# Patient Record
Sex: Female | Born: 1960
Health system: Southern US, Community
[De-identification: ages and names within clinical notes are randomized; demographics above are authoritative.]

## PROBLEM LIST (undated history)

## (undated) ENCOUNTER — Emergency Department (HOSPITAL_COMMUNITY): Admission: EM | Payer: 59 | Source: Home / Self Care

## (undated) DIAGNOSIS — N393 Stress incontinence (female) (male): Secondary | ICD-10-CM

## (undated) DIAGNOSIS — R112 Nausea with vomiting, unspecified: Secondary | ICD-10-CM

## (undated) DIAGNOSIS — M419 Scoliosis, unspecified: Secondary | ICD-10-CM

## (undated) DIAGNOSIS — Z9889 Other specified postprocedural states: Secondary | ICD-10-CM

## (undated) DIAGNOSIS — N3941 Urge incontinence: Secondary | ICD-10-CM

## (undated) DIAGNOSIS — K219 Gastro-esophageal reflux disease without esophagitis: Secondary | ICD-10-CM

## (undated) DIAGNOSIS — M549 Dorsalgia, unspecified: Secondary | ICD-10-CM

## (undated) DIAGNOSIS — D649 Anemia, unspecified: Secondary | ICD-10-CM

## (undated) DIAGNOSIS — I341 Nonrheumatic mitral (valve) prolapse: Secondary | ICD-10-CM

## (undated) DIAGNOSIS — G43909 Migraine, unspecified, not intractable, without status migrainosus: Secondary | ICD-10-CM

## (undated) DIAGNOSIS — R350 Frequency of micturition: Secondary | ICD-10-CM

## (undated) HISTORY — DX: Gastro-esophageal reflux disease without esophagitis: K21.9

## (undated) HISTORY — DX: Migraine, unspecified, not intractable, without status migrainosus: G43.909

## (undated) HISTORY — DX: Scoliosis, unspecified: M41.9

## (undated) HISTORY — PX: BREAST BIOPSY: SHX20

## (undated) HISTORY — PX: AUGMENTATION MAMMAPLASTY: SUR837

## (undated) HISTORY — PX: UPPER GASTROINTESTINAL ENDOSCOPY: SHX188

---

## 2004-11-21 ENCOUNTER — Other Ambulatory Visit: Admission: RE | Admit: 2004-11-21 | Discharge: 2004-11-21 | Payer: Self-pay | Admitting: Obstetrics and Gynecology

## 2006-01-22 ENCOUNTER — Other Ambulatory Visit: Admission: RE | Admit: 2006-01-22 | Discharge: 2006-01-22 | Payer: Self-pay | Admitting: Obstetrics and Gynecology

## 2015-01-30 ENCOUNTER — Ambulatory Visit (INDEPENDENT_AMBULATORY_CARE_PROVIDER_SITE_OTHER): Payer: 59 | Admitting: Internal Medicine

## 2015-01-30 ENCOUNTER — Encounter: Payer: Self-pay | Admitting: Internal Medicine

## 2015-01-30 ENCOUNTER — Other Ambulatory Visit (INDEPENDENT_AMBULATORY_CARE_PROVIDER_SITE_OTHER): Payer: 59

## 2015-01-30 VITALS — BP 108/60 | HR 87 | Temp 98.1°F | Resp 12 | Ht 64.0 in | Wt 113.4 lb

## 2015-01-30 DIAGNOSIS — Z0000 Encounter for general adult medical examination without abnormal findings: Secondary | ICD-10-CM

## 2015-01-30 DIAGNOSIS — M25569 Pain in unspecified knee: Secondary | ICD-10-CM | POA: Insufficient documentation

## 2015-01-30 DIAGNOSIS — K219 Gastro-esophageal reflux disease without esophagitis: Secondary | ICD-10-CM | POA: Insufficient documentation

## 2015-01-30 DIAGNOSIS — G43809 Other migraine, not intractable, without status migrainosus: Secondary | ICD-10-CM

## 2015-01-30 DIAGNOSIS — M25561 Pain in right knee: Secondary | ICD-10-CM

## 2015-01-30 DIAGNOSIS — G43909 Migraine, unspecified, not intractable, without status migrainosus: Secondary | ICD-10-CM | POA: Insufficient documentation

## 2015-01-30 LAB — URINALYSIS, ROUTINE W REFLEX MICROSCOPIC
BILIRUBIN URINE: NEGATIVE
HGB URINE DIPSTICK: NEGATIVE
Ketones, ur: NEGATIVE
LEUKOCYTES UA: NEGATIVE
NITRITE: NEGATIVE
PH: 6 (ref 5.0–8.0)
RBC / HPF: NONE SEEN (ref 0–?)
Specific Gravity, Urine: 1.02 (ref 1.000–1.030)
Total Protein, Urine: NEGATIVE
Urine Glucose: NEGATIVE
Urobilinogen, UA: 0.2 (ref 0.0–1.0)
WBC UA: NONE SEEN (ref 0–?)

## 2015-01-30 LAB — FERRITIN: Ferritin: 6.4 ng/mL — ABNORMAL LOW (ref 10.0–291.0)

## 2015-01-30 LAB — LIPID PANEL
Cholesterol: 154 mg/dL (ref 0–200)
HDL: 75.8 mg/dL
LDL Cholesterol: 67 mg/dL (ref 0–99)
NonHDL: 78.2
Total CHOL/HDL Ratio: 2
Triglycerides: 54 mg/dL (ref 0.0–149.0)
VLDL: 10.8 mg/dL (ref 0.0–40.0)

## 2015-01-30 LAB — COMPREHENSIVE METABOLIC PANEL
ALT: 10 U/L (ref 0–35)
AST: 17 U/L (ref 0–37)
Albumin: 4.2 g/dL (ref 3.5–5.2)
Alkaline Phosphatase: 57 U/L (ref 39–117)
BUN: 13 mg/dL (ref 6–23)
CO2: 31 mEq/L (ref 19–32)
Calcium: 9.3 mg/dL (ref 8.4–10.5)
Chloride: 101 mEq/L (ref 96–112)
Creatinine, Ser: 0.76 mg/dL (ref 0.40–1.20)
GFR: 84.69 mL/min (ref >60.00)
Glucose, Bld: 95 mg/dL (ref 70–99)
Potassium: 4 mEq/L (ref 3.5–5.1)
Sodium: 137 mEq/L (ref 135–145)
TOTAL PROTEIN: 6.9 g/dL (ref 6.0–8.3)
Total Bilirubin: 0.6 mg/dL (ref 0.2–1.2)

## 2015-01-30 LAB — CBC
HCT: 39 % (ref 36.0–46.0)
Hemoglobin: 12.9 g/dL (ref 12.0–15.0)
MCHC: 33.1 g/dL (ref 30.0–36.0)
MCV: 85.3 fl (ref 78.0–100.0)
Platelets: 263 K/uL (ref 150.0–400.0)
RBC: 4.57 Mil/uL (ref 3.87–5.11)
RDW: 15 % (ref 11.5–15.5)
WBC: 4.8 K/uL (ref 4.0–10.5)

## 2015-01-30 MED ORDER — EPINEPHRINE 0.3 MG/0.3ML IJ SOAJ: 0.3000 mg | Freq: Once | INTRAMUSCULAR | Status: DC

## 2015-01-30 MED ORDER — ELETRIPTAN HYDROBROMIDE 40 MG PO TABS: 40.0000 mg | ORAL_TABLET | ORAL | Status: DC | PRN

## 2015-01-30 MED ORDER — GABAPENTIN 800 MG PO TABS: 800.0000 mg | ORAL_TABLET | Freq: Every day | ORAL | Status: DC

## 2015-01-30 NOTE — Progress Notes (Signed)
Pre visit review using our clinic review tool, if applicable. No additional management support is needed unless otherwise documented below in the visit note. 

## 2015-01-30 NOTE — Progress Notes (Signed)
   Subjective:    Patient ID: Vicki Alexander, female    DOB: [birth year], 54 y.o.   MRN: [id number]  HPI The patient is a new 54 YO female who is coming in for wellness. She is out of her gabapentin which she uses for leg pain. She is a PA. Migraines rare. She exercises 4-5 days per week.   PMH, [facility], social history reviewed and updated with the patient.   Review of Systems  Constitutional: Negative for fever, activity change, appetite change, fatigue and unexpected weight change.  HENT: Negative.   Eyes: Negative.   Respiratory: Negative for cough, chest tightness, shortness of breath and wheezing.   Cardiovascular: Negative for chest pain, palpitations and leg swelling.  Gastrointestinal: Negative for nausea, abdominal pain, diarrhea, constipation and abdominal distention.  Musculoskeletal: Negative.   Skin: Negative.   Neurological: Negative.   Psychiatric/Behavioral: Negative.       Objective:   Physical Exam  Constitutional: She is oriented to person, place, and time. She appears well-developed and well-nourished.  HENT:  Head: Normocephalic and atraumatic.  Eyes: EOM are normal.  Neck: Normal range of motion.  Cardiovascular: Normal rate and regular rhythm.   Pulmonary/Chest: Effort normal and breath sounds normal. No respiratory distress. She has no wheezes. She has no rales.  Abdominal: Soft. She exhibits no distension. There is no tenderness. There is no rebound.  Musculoskeletal: She exhibits no edema.  Neurological: She is alert and oriented to person, place, and time. Coordination normal.  Skin: Skin is warm and dry.   Filed Vitals:   01/30/15 0810  BP: 108/60  Pulse: 87  Temp: 98.1 F (36.7 C)  TempSrc: Oral  Resp: 12  Height: 5\' 4"  (1.626 m)  Weight: 113 lb 6.4 oz (51.438 kg)  SpO2: 98%      Assessment & Plan:

## 2015-01-30 NOTE — Assessment & Plan Note (Signed)
Uses nexium OTC with good results.

## 2015-01-30 NOTE — Assessment & Plan Note (Signed)
Colonoscopy due in 2018, not sure of last mammogram and will get records to review. Checking labs today since it has been 5 years. Epi pen provided for bee allergy. She does regularly exercise and is a non-smoker.

## 2015-01-30 NOTE — Assessment & Plan Note (Signed)
Refill of relpax which she takes 3-4 times per month. Declines need for preventative treatment at this time. Is hoping to go into menopause soon and that this will help reduce frequency.

## 2015-01-30 NOTE — Assessment & Plan Note (Signed)
Refill of gabapentin provided today and given some nocturnal movement of the leg will check ferritin for possible iron deficiency.

## 2015-01-30 NOTE — Patient Instructions (Signed)
We will check on the blood and the urine today and call you back with the results.   We have refilled your medicines and can see you back next year.  We will get your records and update the computer.   If you have any problems or questions please call the office.   Health Maintenance Adopting a healthy lifestyle and getting preventive care can go a long way to promote health and wellness. Talk with your health care provider about what schedule of regular examinations is right for you. This is a good chance for you to check in with your provider about disease prevention and staying healthy. In between checkups, there are plenty of things you can do on your own. Experts have done a lot of research about which lifestyle changes and preventive measures are most likely to keep you healthy. Ask your health care provider for more information. WEIGHT AND DIET  Eat a healthy diet  Be sure to include plenty of vegetables, fruits, low-fat dairy products, and lean protein.  Do not eat a lot of foods high in solid fats, added sugars, or salt.  Get regular exercise. This is one of the most important things you can do for your health.  Most adults should exercise for at least 150 minutes each week. The exercise should increase your heart rate and make you sweat (moderate-intensity exercise).  Most adults should also do strengthening exercises at least twice a week. This is in addition to the moderate-intensity exercise.  Maintain a healthy weight  Body mass index (BMI) is a measurement that can be used to identify possible weight problems. It estimates body fat based on height and weight. Your health care provider can help determine your BMI and help you achieve or maintain a healthy weight.  For females 34 years of age and older:   A BMI below 18.5 is considered underweight.  A BMI of 18.5 to 24.9 is normal.  A BMI of 25 to 29.9 is considered overweight.  A BMI of 30 and above is considered  obese.  Watch levels of cholesterol and blood lipids  You should start having your blood tested for lipids and cholesterol at 53 years of age, then have this test every 5 years.  You may need to have your cholesterol levels checked more often if:  Your lipid or cholesterol levels are high.  You are older than 53 years of age.  You are at high risk for heart disease.  CANCER SCREENING   Lung Cancer  Lung cancer screening is recommended for adults 53-74 years old who are at high risk for lung cancer because of a history of smoking.  A yearly low-dose CT scan of the lungs is recommended for people who:  Currently smoke.  Have quit within the past 15 years.  Have at least a 30-pack-year history of smoking. A pack year is smoking an average of one pack of cigarettes a day for 1 year.  Yearly screening should continue until it has been 15 years since you quit.  Yearly screening should stop if you develop a health problem that would prevent you from having lung cancer treatment.  Breast Cancer  Practice breast self-awareness. This means understanding how your breasts normally appear and feel.  It also means doing regular breast self-exams. Let your health care provider know about any changes, no matter how small.  If you are in your 20s or 30s, you should have a clinical breast exam (CBE) by a health  care provider every 1-3 years as part of a regular health exam.  If you are 53 or older, have a CBE every year. Also consider having a breast X-ray (mammogram) every year.  If you have a family history of breast cancer, talk to your health care provider about genetic screening.  If you are at high risk for breast cancer, talk to your health care provider about having an MRI and a mammogram every year.  Breast cancer gene (BRCA) assessment is recommended for women who have family members with BRCA-related cancers. BRCA-related cancers  include:  Breast.  Ovarian.  Tubal.  Peritoneal cancers.  Results of the assessment will determine the need for genetic counseling and BRCA1 and BRCA2 testing. Cervical Cancer Routine pelvic examinations to screen for cervical cancer are no longer recommended for nonpregnant women who are considered low risk for cancer of the pelvic organs (ovaries, uterus, and vagina) and who do not have symptoms. A pelvic examination may be necessary if you have symptoms including those associated with pelvic infections. Ask your health care provider if a screening pelvic exam is right for you.   The Pap test is the screening test for cervical cancer for women who are considered at risk.  If you had a hysterectomy for a problem that was not cancer or a condition that could lead to cancer, then you no longer need Pap tests.  If you are older than 65 years, and you have had normal Pap tests for the past 10 years, you no longer need to have Pap tests.  If you have had past treatment for cervical cancer or a condition that could lead to cancer, you need Pap tests and screening for cancer for at least 20 years after your treatment.  If you no longer get a Pap test, assess your risk factors if they change (such as having a new sexual partner). This can affect whether you should start being screened again.  Some women have medical problems that increase their chance of getting cervical cancer. If this is the case for you, your health care provider may recommend more frequent screening and Pap tests.  The human papillomavirus (HPV) test is another test that may be used for cervical cancer screening. The HPV test looks for the virus that can cause cell changes in the cervix. The cells collected during the Pap test can be tested for HPV.  The HPV test can be used to screen women 53 years of age and older. Getting tested for HPV can extend the interval between normal Pap tests from three to five years.  An HPV  test also should be used to screen women of any age who have unclear Pap test results.  After 53 years of age, women should have HPV testing as often as Pap tests.  Colorectal Cancer  This type of cancer can be detected and often prevented.  Routine colorectal cancer screening usually begins at 53 years of age and continues through 53 years of age.  Your health care provider may recommend screening at an earlier age if you have risk factors for colon cancer.  Your health care provider may also recommend using home test kits to check for hidden blood in the stool.  A small camera at the end of a tube can be used to examine your colon directly (sigmoidoscopy or colonoscopy). This is done to check for the earliest forms of colorectal cancer.  Routine screening usually begins at age 62.  Direct examination of the  colon should be repeated every 5-10 years through 53 years of age. However, you may need to be screened more often if early forms of precancerous polyps or small growths are found. Skin Cancer  Check your skin from head to toe regularly.  Tell your health care provider about any new moles or changes in moles, especially if there is a change in a mole's shape or color.  Also tell your health care provider if you have a mole that is larger than the size of a pencil eraser.  Always use sunscreen. Apply sunscreen liberally and repeatedly throughout the day.  Protect yourself by wearing long sleeves, pants, a wide-brimmed hat, and sunglasses whenever you are outside. HEART DISEASE, DIABETES, AND HIGH BLOOD PRESSURE   Have your blood pressure checked at least every 1-2 years. High blood pressure causes heart disease and increases the risk of stroke.  If you are between 78 years and 29 years old, ask your health care provider if you should take aspirin to prevent strokes.  Have regular diabetes screenings. This involves taking a blood sample to check your fasting blood sugar  level.  If you are at a normal weight and have a low risk for diabetes, have this test once every three years after 53 years of age.  If you are overweight and have a high risk for diabetes, consider being tested at a younger age or more often. PREVENTING INFECTION  Hepatitis B  If you have a higher risk for hepatitis B, you should be screened for this virus. You are considered at high risk for hepatitis B if:  You were born in a country where hepatitis B is common. Ask your health care provider which countries are considered high risk.  Your parents were born in a high-risk country, and you have not been immunized against hepatitis B (hepatitis B vaccine).  You have HIV or AIDS.  You use needles to inject street drugs.  You live with someone who has hepatitis B.  You have had sex with someone who has hepatitis B.  You get hemodialysis treatment.  You take certain medicines for conditions, including cancer, organ transplantation, and autoimmune conditions. Hepatitis C  Blood testing is recommended for:  Everyone born from [birth year] through 1964.  Anyone with known risk factors for hepatitis C. Sexually transmitted infections (STIs)  You should be screened for sexually transmitted infections (STIs) including gonorrhea and chlamydia if:  You are sexually active and are younger than 53 years of age.  You are older than 53 years of age and your health care provider tells you that you are at risk for this type of infection.  Your sexual activity has changed since you were last screened and you are at an increased risk for chlamydia or gonorrhea. Ask your health care provider if you are at risk.  If you do not have HIV, but are at risk, it may be recommended that you take a prescription medicine daily to prevent HIV infection. This is called pre-exposure prophylaxis (PrEP). You are considered at risk if:  You are sexually active and do not regularly use condoms or know the HIV status  of your partner(s).  You take drugs by injection.  You are sexually active with a partner who has HIV. Talk with your health care provider about whether you are at high risk of being infected with HIV. If you choose to begin PrEP, you should first be tested for HIV. You should then be tested every 3 months  for as long as you are taking PrEP.  PREGNANCY   If you are premenopausal and you may become pregnant, ask your health care provider about preconception counseling.  If you may become pregnant, take 400 to 800 micrograms (mcg) of folic acid every day.  If you want to prevent pregnancy, talk to your health care provider about birth control (contraception). OSTEOPOROSIS AND MENOPAUSE   Osteoporosis is a disease in which the bones lose minerals and strength with aging. This can result in serious bone fractures. Your risk for osteoporosis can be identified using a bone density scan.  If you are 64 years of age or older, or if you are at risk for osteoporosis and fractures, ask your health care provider if you should be screened.  Ask your health care provider whether you should take a calcium or vitamin D supplement to lower your risk for osteoporosis.  Menopause may have certain physical symptoms and risks.  Hormone replacement therapy may reduce some of these symptoms and risks. Talk to your health care provider about whether hormone replacement therapy is right for you.  HOME CARE INSTRUCTIONS   Schedule regular health, dental, and eye exams.  Stay current with your immunizations.   Do not use any tobacco products including cigarettes, chewing tobacco, or electronic cigarettes.  If you are pregnant, do not drink alcohol.  If you are breastfeeding, limit how much and how often you drink alcohol.  Limit alcohol intake to no more than 1 drink per day for nonpregnant women. One drink equals 12 ounces of beer, 5 ounces of wine, or 1 ounces of hard liquor.  Do not use street  drugs.  Do not share needles.  Ask your health care provider for help if you need support or information about quitting drugs.  Tell your health care provider if you often feel depressed.  Tell your health care provider if you have ever been abused or do not feel safe at home. Document Released: 03/10/2011 Document Revised: 01/09/2014 Document Reviewed: 07/27/2013 [facility] Patient Information 2015 [place], [place]. This information is not intended to replace advice given to you by your health care provider. Make sure you discuss any questions you have with your health care provider.

## 2015-02-01 ENCOUNTER — Telehealth: Payer: Self-pay | Admitting: Internal Medicine

## 2015-02-01 NOTE — Telephone Encounter (Signed)
Pt was returning you phone call.  She would like for you to call her back on her cell when you have a chance

## 2015-02-01 NOTE — Telephone Encounter (Signed)
Spoke with patient and discussed lab results.  

## 2015-02-14 ENCOUNTER — Telehealth: Payer: Self-pay | Admitting: Internal Medicine

## 2015-02-14 NOTE — Telephone Encounter (Signed)
Received records from [facility] forwarded to Dr. Doug Sou 02/14/15 fbg

## 2015-07-11 ENCOUNTER — Telehealth: Payer: Self-pay | Admitting: Internal Medicine

## 2015-07-11 NOTE — Telephone Encounter (Signed)
disregard

## 2015-07-12 ENCOUNTER — Ambulatory Visit: Payer: 59

## 2015-07-12 ENCOUNTER — Ambulatory Visit (INDEPENDENT_AMBULATORY_CARE_PROVIDER_SITE_OTHER): Payer: 59

## 2015-07-12 DIAGNOSIS — Z23 Encounter for immunization: Secondary | ICD-10-CM | POA: Diagnosis not present

## 2015-08-08 ENCOUNTER — Encounter: Payer: Self-pay | Admitting: Obstetrics & Gynecology

## 2015-08-08 ENCOUNTER — Ambulatory Visit (INDEPENDENT_AMBULATORY_CARE_PROVIDER_SITE_OTHER): Payer: 59 | Admitting: Obstetrics & Gynecology

## 2015-08-08 VITALS — BP 119/73 | HR 85 | Temp 98.2°F | Wt 119.2 lb

## 2015-08-08 DIAGNOSIS — Z01419 Encounter for gynecological examination (general) (routine) without abnormal findings: Secondary | ICD-10-CM

## 2015-08-08 DIAGNOSIS — Z1151 Encounter for screening for human papillomavirus (HPV): Secondary | ICD-10-CM | POA: Diagnosis not present

## 2015-08-08 DIAGNOSIS — R1900 Intra-abdominal and pelvic swelling, mass and lump, unspecified site: Secondary | ICD-10-CM

## 2015-08-08 DIAGNOSIS — Z0000 Encounter for general adult medical examination without abnormal findings: Secondary | ICD-10-CM

## 2015-08-08 DIAGNOSIS — Z124 Encounter for screening for malignant neoplasm of cervix: Secondary | ICD-10-CM | POA: Diagnosis not present

## 2015-08-08 DIAGNOSIS — R198 Other specified symptoms and signs involving the digestive system and abdomen: Secondary | ICD-10-CM

## 2015-08-08 LAB — TSH: TSH: 0.854 u[IU]/mL (ref 0.350–4.500)

## 2015-08-08 NOTE — Progress Notes (Signed)
Subjective:    Vicki Alexander is a 54 y.o. MW P2 (85 and 47 yo kids) female who presents for an annual exam. The patient has no complaints today except increased weight (10 pounds) and increasing abdominal girth. She is concerned that she could have a mass. She also has had frequent urination but no dysuria. She started GSUI about a month ago. The patient is sexually active. GYN screening history: last pap: was normal. The patient wears seatbelts: yes. The patient participates in regular exercise: yes. Has the patient ever been transfused or tattooed?: no. The patient reports that there is not domestic violence in her life.   Menstrual History: OB History    No data available      Menarche age: 43  No LMP recorded (lmp unknown).    The following portions of the patient's history were reviewed and updated as appropriate: allergies, current medications, past family history, past medical history, past social history, past surgical history and problem list.  Review of Systems Pertinent items are noted in HPI. Married for 24 years. Denies dyspareunia, uses lube. Periods getting much lighter. Some hot flashes recently. Husband s/p vasectomy. 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 Colonscopy due in 2 years.   Objective:    BP 119/73 mmHg  Pulse 85  Temp(Src) 98.2 F (36.8 C) (Oral)  Wt 119 lb 3.2 oz (54.069 kg)  LMP  (LMP Unknown)  General Appearance:    Alert, cooperative, no distress, appears stated age  Head:    Normocephalic, without obvious abnormality, atraumatic  Eyes:    PERRL, conjunctiva/corneas clear, EOM's intact, fundi    benign, both eyes  Ears:    Normal TM's and external ear canals, both ears  Nose:   Nares normal, septum midline, mucosa normal, no drainage    or sinus tenderness  Throat:   Lips, mucosa, and tongue normal; teeth and gums normal  Neck:   Supple, symmetrical, trachea midline, no adenopathy;    thyroid:  no enlargement/tenderness/nodules; no  carotid   bruit or JVD  Back:     Symmetric, no curvature, ROM normal, no CVA tenderness  Lungs:     Clear to auscultation bilaterally, respirations unlabored  Chest Wall:    No tenderness or deformity   Heart:    Regular rate and rhythm, S1 and S2 normal, no murmur, rub   or gallop  Breast Exam:    No tenderness, masses, or nipple abnormality  Abdomen:     Soft, non-tender, bowel sounds active all four quadrants,    no masses, no organomegaly  Genitalia:    Normal female without lesion, discharge or tenderness, difficult exam due to her tight abdominal muscles     Extremities:   Extremities normal, atraumatic, no cyanosis or edema  Pulses:   2+ and symmetric all extremities  Skin:   Skin color, texture, turgor normal, no rashes or lesions  Lymph nodes:   Cervical, supraclavicular, and axillary nodes normal  Neurologic:   CNII-XII intact, normal strength, sensation and reflexes    throughout  .    Assessment:    Healthy female exam.    Plan:     Breast self exam technique reviewed and patient encouraged to perform self-exam monthly. Mammogram. Thin prep Pap smear.  with cotesting Schedule gyn u/s Schedule My Risk at [facility]

## 2015-08-09 LAB — CYTOLOGY - PAP

## 2015-08-10 ENCOUNTER — Other Ambulatory Visit: Payer: Self-pay

## 2015-08-10 DIAGNOSIS — Z1231 Encounter for screening mammogram for malignant neoplasm of breast: Secondary | ICD-10-CM

## 2015-08-13 ENCOUNTER — Other Ambulatory Visit (INDEPENDENT_AMBULATORY_CARE_PROVIDER_SITE_OTHER): Payer: 59

## 2015-08-13 ENCOUNTER — Other Ambulatory Visit (HOSPITAL_COMMUNITY): Payer: Self-pay | Admitting: Obstetrics & Gynecology

## 2015-08-13 ENCOUNTER — Ambulatory Visit (HOSPITAL_COMMUNITY)
Admission: RE | Admit: 2015-08-13 | Discharge: 2015-08-13 | Disposition: A | Discharge status: Home / Self Care | Payer: 59 | Source: Ambulatory Visit | Attending: Internal Medicine | Admitting: Internal Medicine

## 2015-08-13 DIAGNOSIS — Z1379 Encounter for other screening for genetic and chromosomal anomalies: Secondary | ICD-10-CM

## 2015-08-13 DIAGNOSIS — Z1231 Encounter for screening mammogram for malignant neoplasm of breast: Secondary | ICD-10-CM

## 2015-08-13 DIAGNOSIS — Z803 Family history of malignant neoplasm of breast: Secondary | ICD-10-CM | POA: Diagnosis not present

## 2015-08-13 DIAGNOSIS — R1900 Intra-abdominal and pelvic swelling, mass and lump, unspecified site: Secondary | ICD-10-CM | POA: Diagnosis not present

## 2015-08-13 DIAGNOSIS — Z8041 Family history of malignant neoplasm of ovary: Secondary | ICD-10-CM

## 2015-08-13 DIAGNOSIS — R635 Abnormal weight gain: Secondary | ICD-10-CM | POA: Diagnosis not present

## 2015-08-13 NOTE — Progress Notes (Signed)
Patient presents for My Risk blood draw per Dr. Hulan Fray. Patient states she saw Dr. Hulan Fray at [facility] and they did not have the My Risk screening kit there. Kathrene Alu RN BSN.

## 2015-08-14 ENCOUNTER — Ambulatory Visit (HOSPITAL_COMMUNITY)
Admission: RE | Admit: 2015-08-14 | Discharge: 2015-08-14 | Disposition: A | Discharge status: Home / Self Care | Payer: 59 | Source: Ambulatory Visit | Attending: Obstetrics & Gynecology | Admitting: Obstetrics & Gynecology

## 2015-08-14 DIAGNOSIS — Z1231 Encounter for screening mammogram for malignant neoplasm of breast: Secondary | ICD-10-CM | POA: Diagnosis not present

## 2015-08-14 DIAGNOSIS — R198 Other specified symptoms and signs involving the digestive system and abdomen: Secondary | ICD-10-CM

## 2015-08-21 ENCOUNTER — Encounter: Payer: Self-pay | Admitting: Obstetrics & Gynecology

## 2015-08-21 ENCOUNTER — Ambulatory Visit (INDEPENDENT_AMBULATORY_CARE_PROVIDER_SITE_OTHER): Payer: 59 | Admitting: Obstetrics & Gynecology

## 2015-08-21 DIAGNOSIS — N393 Stress incontinence (female) (male): Secondary | ICD-10-CM

## 2015-08-21 NOTE — Addendum Note (Signed)
Addended by: Phill Myron on: 08/21/2015 04:33 PM   Modules accepted: Orders

## 2015-08-21 NOTE — Progress Notes (Signed)
   Subjective:    Patient ID: Vicki Alexander, female    DOB: June 23, 1961, 54 y.o.   MRN: [id number]  HPI 54 yo MW P2 (73 an d15 yo kids, both VAVD and NSVD).She is here today to discuss her u/s results. They were normal.  She is also here with the issue of GSUI. This started about 2-3 years ago but has gotten "horrible" in the last 2-3 months. She denies any urge incontinence.   Review of Systems     Objective:   Physical Exam [id number] Breathing, conversing, and ambulating normally Abd- benign +Qtip test       Assessment & Plan:  GSUI- I offered her a urology consult versus scheduling her for a mid urethral sling. She opts to have the sling at this practice.

## 2015-08-22 ENCOUNTER — Encounter (HOSPITAL_COMMUNITY): Payer: Self-pay | Admitting: *Deleted

## 2015-08-23 LAB — URINE CULTURE
Colony Count: NO GROWTH
ORGANISM ID, BACTERIA: NO GROWTH

## 2015-08-28 ENCOUNTER — Encounter (HOSPITAL_COMMUNITY): Payer: Self-pay | Admitting: *Deleted

## 2015-08-29 ENCOUNTER — Ambulatory Visit: Payer: 59

## 2015-10-03 MED FILL — RELPAX 40 MG TABLET: 40 | 30 days supply | Qty: 9 | Fill #1

## 2015-10-30 ENCOUNTER — Encounter (HOSPITAL_COMMUNITY)
Admission: RE | Admit: 2015-10-30 | Discharge: 2015-10-30 | Disposition: A | Discharge status: Home / Self Care | Payer: 59 | Source: Ambulatory Visit | Attending: Obstetrics & Gynecology | Admitting: Obstetrics & Gynecology

## 2015-10-30 ENCOUNTER — Encounter (HOSPITAL_COMMUNITY): Payer: Self-pay

## 2015-10-30 DIAGNOSIS — Z01812 Encounter for preprocedural laboratory examination: Secondary | ICD-10-CM | POA: Diagnosis not present

## 2015-10-30 HISTORY — DX: Dorsalgia, unspecified: M54.9

## 2015-10-30 HISTORY — DX: Nausea with vomiting, unspecified: Z98.890

## 2015-10-30 HISTORY — DX: Nonrheumatic mitral (valve) prolapse: I34.1

## 2015-10-30 HISTORY — DX: Urge incontinence: N39.41

## 2015-10-30 HISTORY — DX: Anemia, unspecified: D64.9

## 2015-10-30 HISTORY — DX: Nausea with vomiting, unspecified: R11.2

## 2015-10-30 HISTORY — DX: Frequency of micturition: R35.0

## 2015-10-30 HISTORY — DX: Stress incontinence (female) (male): N39.3

## 2015-10-30 LAB — ABO/RH: ABO/RH(D): B POS

## 2015-10-30 LAB — CBC
HEMATOCRIT: 42.7 % (ref 36.0–46.0)
HEMOGLOBIN: 14.5 g/dL (ref 12.0–15.0)
MCH: 30 pg (ref 26.0–34.0)
MCHC: 34 g/dL (ref 30.0–36.0)
MCV: 88.2 fL (ref 78.0–100.0)
Platelets: 283 10*3/uL (ref 150–400)
RBC: 4.84 MIL/uL (ref 3.87–5.11)
RDW: 14.1 % (ref 11.5–15.5)
WBC: 5.4 10*3/uL (ref 4.0–10.5)

## 2015-10-30 LAB — TYPE AND SCREEN
ABO/RH(D): B POS
Antibody Screen: NEGATIVE

## 2015-10-30 NOTE — Patient Instructions (Signed)
Your procedure is scheduled on:  Tuesday, Feb, 28, 2017  Enter through the [employer] of [facility] at: 6:00 A.M.  Pick up the phone at the desk and dial [phone].  Call this number if you have problems the morning of surgery: [phone].  Remember:  Do NOT eat food or drink after: Midnight Monday  Take these medicines the morning of surgery with a SIP OF WATER: Nexium  Do NOT wear jewelry (body piercing), metal hair clips/bobby pins, make-up, or nail polish. Do NOT wear lotions, powders, or perfumes.  You may wear deoderant. Do NOT shave for 48 hours prior to surgery. Do NOT bring valuables to the hospital. Contacts, dentures, or bridgework may not be worn into surgery. Leave suitcase in car.  After surgery it may be brought to your room.  For patients admitted to the hospital, checkout time is 11:00 AM the day of discharge. Have a responsible adult drive you home and stay with you for 24 hours after your procedure

## 2015-11-05 MED ORDER — DOXYCYCLINE HYCLATE 100 MG IV SOLR
200.0000 mg | INTRAVENOUS | Status: AC
  Administered 2015-11-06: 200 mg via INTRAVENOUS
  Filled 2015-11-05: qty 200

## 2015-11-06 ENCOUNTER — Ambulatory Visit (HOSPITAL_COMMUNITY): Payer: 59 | Admitting: Registered Nurse

## 2015-11-06 ENCOUNTER — Ambulatory Visit (HOSPITAL_COMMUNITY)
Admission: RE | Admit: 2015-11-06 | Discharge: 2015-11-06 | Disposition: A | Discharge status: Home / Self Care | Payer: 59 | Source: Ambulatory Visit | Attending: Obstetrics & Gynecology | Admitting: Obstetrics & Gynecology

## 2015-11-06 ENCOUNTER — Encounter (HOSPITAL_COMMUNITY)
Admission: RE | Disposition: A | Discharge status: Home / Self Care | Payer: Self-pay | Source: Ambulatory Visit | Attending: Obstetrics & Gynecology

## 2015-11-06 ENCOUNTER — Encounter (HOSPITAL_COMMUNITY): Payer: Self-pay | Admitting: *Deleted

## 2015-11-06 DIAGNOSIS — N393 Stress incontinence (female) (male): Secondary | ICD-10-CM

## 2015-11-06 DIAGNOSIS — I341 Nonrheumatic mitral (valve) prolapse: Secondary | ICD-10-CM | POA: Diagnosis not present

## 2015-11-06 DIAGNOSIS — N3946 Mixed incontinence: Secondary | ICD-10-CM | POA: Diagnosis not present

## 2015-11-06 DIAGNOSIS — M549 Dorsalgia, unspecified: Secondary | ICD-10-CM | POA: Diagnosis not present

## 2015-11-06 DIAGNOSIS — M419 Scoliosis, unspecified: Secondary | ICD-10-CM | POA: Diagnosis not present

## 2015-11-06 DIAGNOSIS — J45909 Unspecified asthma, uncomplicated: Secondary | ICD-10-CM | POA: Diagnosis not present

## 2015-11-06 DIAGNOSIS — G43909 Migraine, unspecified, not intractable, without status migrainosus: Secondary | ICD-10-CM | POA: Insufficient documentation

## 2015-11-06 DIAGNOSIS — K219 Gastro-esophageal reflux disease without esophagitis: Secondary | ICD-10-CM | POA: Insufficient documentation

## 2015-11-06 DIAGNOSIS — Z91030 Bee allergy status: Secondary | ICD-10-CM | POA: Insufficient documentation

## 2015-11-06 DIAGNOSIS — D649 Anemia, unspecified: Secondary | ICD-10-CM | POA: Diagnosis not present

## 2015-11-06 HISTORY — PX: CYSTO: SHX6284

## 2015-11-06 HISTORY — PX: PUBOVAGINAL SLING: SHX1035

## 2015-11-06 LAB — PREGNANCY, URINE: PREG TEST UR: NEGATIVE

## 2015-11-06 SURGERY — CREATION, PUBOVAGINAL SLING
Anesthesia: General | Site: Vagina

## 2015-11-06 MED ORDER — PROPOFOL 10 MG/ML IV BOLUS
INTRAVENOUS | Status: AC
  Filled 2015-11-06: qty 20

## 2015-11-06 MED ORDER — BUPIVACAINE HCL (PF) 0.5 % IJ SOLN
INTRAMUSCULAR | Status: AC
  Filled 2015-11-06: qty 30

## 2015-11-06 MED ORDER — DEXAMETHASONE SODIUM PHOSPHATE 4 MG/ML IJ SOLN
INTRAMUSCULAR | Status: AC
  Filled 2015-11-06: qty 1

## 2015-11-06 MED ORDER — PROMETHAZINE HCL 25 MG/ML IJ SOLN: 6.2500 mg | INTRAMUSCULAR | Status: DC | PRN

## 2015-11-06 MED ORDER — BUPIVACAINE HCL (PF) 0.5 % IJ SOLN
INTRAMUSCULAR | Status: DC | PRN
  Administered 2015-11-06: 30 mL

## 2015-11-06 MED ORDER — PROPOFOL 10 MG/ML IV BOLUS
INTRAVENOUS | Status: DC | PRN
  Administered 2015-11-06: 150 mg via INTRAVENOUS

## 2015-11-06 MED ORDER — STERILE WATER FOR IRRIGATION IR SOLN
Status: DC | PRN
  Administered 2015-11-06: 1000 mL via INTRAVESICAL

## 2015-11-06 MED ORDER — FENTANYL CITRATE (PF) 100 MCG/2ML IJ SOLN: 25.0000 ug | INTRAMUSCULAR | Status: DC | PRN

## 2015-11-06 MED ORDER — OXYCODONE-ACETAMINOPHEN 5-325 MG PO TABS: 1.0000 | ORAL_TABLET | Freq: Four times a day (QID) | ORAL | Status: DC | PRN

## 2015-11-06 MED ORDER — FENTANYL CITRATE (PF) 100 MCG/2ML IJ SOLN
INTRAMUSCULAR | Status: DC | PRN
  Administered 2015-11-06 (×2): 50 ug via INTRAVENOUS

## 2015-11-06 MED ORDER — FENTANYL CITRATE (PF) 250 MCG/5ML IJ SOLN
INTRAMUSCULAR | Status: AC
  Filled 2015-11-06: qty 5

## 2015-11-06 MED ORDER — KETOROLAC TROMETHAMINE 30 MG/ML IJ SOLN: 30.0000 mg | Freq: Once | INTRAMUSCULAR | Status: DC

## 2015-11-06 MED ORDER — IBUPROFEN 600 MG PO TABS: 600.0000 mg | ORAL_TABLET | Freq: Four times a day (QID) | ORAL | Status: DC | PRN

## 2015-11-06 MED ORDER — LIDOCAINE HCL (CARDIAC) 20 MG/ML IV SOLN
INTRAVENOUS | Status: AC
  Filled 2015-11-06: qty 5

## 2015-11-06 MED ORDER — MIDAZOLAM HCL 2 MG/2ML IJ SOLN
INTRAMUSCULAR | Status: AC
  Filled 2015-11-06: qty 2

## 2015-11-06 MED ORDER — ONDANSETRON HCL 4 MG/2ML IJ SOLN
INTRAMUSCULAR | Status: DC | PRN
  Administered 2015-11-06: 4 mg via INTRAVENOUS

## 2015-11-06 MED ORDER — LIDOCAINE HCL (CARDIAC) 20 MG/ML IV SOLN
INTRAVENOUS | Status: DC | PRN
  Administered 2015-11-06: 50 mg via INTRAVENOUS

## 2015-11-06 MED ORDER — SCOPOLAMINE 1 MG/3DAYS TD PT72
MEDICATED_PATCH | TRANSDERMAL | Status: AC
  Administered 2015-11-06: 1.5 mg via TRANSDERMAL
  Filled 2015-11-06: qty 1

## 2015-11-06 MED ORDER — LACTATED RINGERS IV SOLN
INTRAVENOUS | Status: DC
  Administered 2015-11-06 (×2): via INTRAVENOUS

## 2015-11-06 MED ORDER — SCOPOLAMINE 1 MG/3DAYS TD PT72
1.0000 | MEDICATED_PATCH | Freq: Once | TRANSDERMAL | Status: DC
  Administered 2015-11-06: 1.5 mg via TRANSDERMAL

## 2015-11-06 MED ORDER — ONDANSETRON HCL 4 MG/2ML IJ SOLN
INTRAMUSCULAR | Status: AC
  Filled 2015-11-06: qty 2

## 2015-11-06 MED ORDER — DEXAMETHASONE SODIUM PHOSPHATE 4 MG/ML IJ SOLN
INTRAMUSCULAR | Status: DC | PRN
  Administered 2015-11-06: 4 mg via INTRAVENOUS

## 2015-11-06 MED ORDER — MIDAZOLAM HCL 5 MG/5ML IJ SOLN
INTRAMUSCULAR | Status: DC | PRN
  Administered 2015-11-06: 2 mg via INTRAVENOUS

## 2015-11-06 MED FILL — OXYCODONE/APAP 5-325: 5-325 | 4 days supply | Qty: 30 | Fill #0

## 2015-11-06 SURGICAL SUPPLY — 29 items
BLADE SURG 11 STRL SS (BLADE) ×3 IMPLANT
BLADE SURG 15 STRL LF C SS BP (BLADE) ×2 IMPLANT
BLADE SURG 15 STRL SS (BLADE) ×6
CANISTER SUCT 3000ML (MISCELLANEOUS) ×3 IMPLANT
CATH FOLEY 2WAY SLVR  5CC 18FR (CATHETERS) ×1
CATH FOLEY 2WAY SLVR 5CC 18FR (CATHETERS) ×2 IMPLANT
CLOTH BEACON ORANGE TIMEOUT ST (SAFETY) ×3 IMPLANT
DECANTER SPIKE VIAL GLASS SM (MISCELLANEOUS) ×1 IMPLANT
GLOVE BIO SURGEON STRL SZ 6.5 (GLOVE) ×3 IMPLANT
GLOVE BIOGEL PI IND STRL 6.5 (GLOVE) ×2 IMPLANT
GLOVE BIOGEL PI IND STRL 7.0 (GLOVE) ×2 IMPLANT
GLOVE BIOGEL PI INDICATOR 6.5 (GLOVE) ×1
GLOVE BIOGEL PI INDICATOR 7.0 (GLOVE) ×2
GOWN STRL REUS W/TWL LRG LVL3 (GOWN DISPOSABLE) ×12 IMPLANT
HEMOSTAT SURGICEL 2X3 (HEMOSTASIS) IMPLANT
LIQUID BAND (GAUZE/BANDAGES/DRESSINGS) ×3 IMPLANT
NDL SPNL 18GX3.5 QUINCKE PK (NEEDLE) ×2 IMPLANT
NEEDLE SPNL 18GX3.5 QUINCKE PK (NEEDLE) ×3 IMPLANT
NS IRRIG 1000ML POUR BTL (IV SOLUTION) ×2 IMPLANT
PACK VAGINAL WOMENS (CUSTOM PROCEDURE TRAY) ×3 IMPLANT
SET CYSTO W/LG BORE CLAMP LF (SET/KITS/TRAYS/PACK) ×3 IMPLANT
SLING TVT EXACT (Sling) ×1 IMPLANT
SUT VIC AB 2-0 SH 27 (SUTURE)
SUT VIC AB 2-0 SH 27XBRD (SUTURE) IMPLANT
SUT VIC AB 4-0 SH 27 (SUTURE) ×6
SUT VIC AB 4-0 SH 27XANBCTRL (SUTURE) ×4 IMPLANT
TOWEL OR 17X24 6PK STRL BLUE (TOWEL DISPOSABLE) ×6 IMPLANT
TRAY FOLEY CATH SILVER 14FR (SET/KITS/TRAYS/PACK) ×2 IMPLANT
WATER STERILE IRR 1000ML POUR (IV SOLUTION) ×3 IMPLANT

## 2015-11-06 NOTE — Op Note (Signed)
11/06/2015  10:35 AM  PATIENT:  Vicki Alexander  55 y.o. female  PRE-OPERATIVE DIAGNOSIS:  GENUINE URINARY STRESS INCONTINENCE  POST-OPERATIVE DIAGNOSIS:  SAME  PROCEDURE:  Procedure(s): MID URETHERAL SLING (N/A) CYSTO (N/A)  SURGEON:  Surgeon(s) and Role:    * Emily Filbert, MD - Primary    * Lavonia Drafts, MD - Assisting  ANESTHESIA:   general  EBL:  Total I/O In: 1400 [I.V.:1400] Out: 40 [Urine:20; Blood:20]  BLOOD ADMINISTERED:none  DRAINS: none   LOCAL MEDICATIONS USED:  MARCAINE     SPECIMEN:  No Specimen  DISPOSITION OF SPECIMEN:  N/A  COUNTS:  YES  TOURNIQUET:  * No tourniquets in log *  DICTATION: .Dragon Dictation  PLAN OF CARE: Discharge to home after PACU  PATIENT DISPOSITION:  PACU - hemodynamically stable.   Delay start of Pharmacological VTE agent (>24hrs) due to surgical blood loss or risk of bleeding: not applicable  The risks, benefits, and alternatives of surgery were explained, understood, and accepted. All questions were answered. Consent form was signed. In the operating room, she was placed in the dorsal lithotomy position. Once comfortable, she was intubated. Her vagina was prepped and draped in the usual sterile fashion.I infiltrated the spaces about 2 cm lateral to the midline behind the symphysis pubis on each side with a total of 20 cc of 0.5% marcaien. I then infiltrated the vaginal mucosa with 0.5% marcaine. I used a total of 30 mL of this anesthetic during the whole case. I made a 1 cm incision just below my Allis clamp, and used Metzenbaum scissors to dissect  each side of the urethral meatus. Please note that I had a urethral stylet in place, deviating the bladder out of the operative site. I then placed the TVT Exact mid urethral sling to the patient's right and then to the left. I performed cystoscopy and noted no evidence of a cystotomy. I left approximately 200 mL of saline in her bladder. I then elevated the sling, making sure  there was no tension on the sling. I placed a Kelly clamps between the sling and the patient's urethra to prevent overtightening. The plastic sheaths were removed, and the sling was cut at the skin edges. The skin edges were closed with Dermabond. The vaginal mucosal incision was closed with a 4-0 Vicryl running locking suture. Excellent hemostasis was noted. She was extubated and taken to the recovery room in excellent condition. The instrument sponge and needle counts were correct.

## 2015-11-06 NOTE — Transfer of Care (Signed)
Immediate Anesthesia Transfer of Care Note  Patient: Vicki Alexander  Procedure(s) Performed: Procedure(s): MID URETHERAL SLING (N/A) CYSTO (N/A)  Patient Location: PACU  Anesthesia Type:General  Level of Consciousness: awake, alert  and oriented  Airway & Oxygen Therapy: Patient Spontanous Breathing and Patient connected to nasal cannula oxygen  Post-op Assessment: Report given to RN  Post vital signs: Reviewed  Last Vitals:  Filed Vitals:   11/06/15 0626  BP: 100/64  Pulse: 88  Temp: 36.6 C  Resp: 16    Complications: No apparent anesthesia complications

## 2015-11-06 NOTE — Anesthesia Postprocedure Evaluation (Signed)
Anesthesia Post Note  Patient: Vicki Alexander  Procedure(s) Performed: Procedure(s) (LRB): MID URETHERAL SLING (N/A) CYSTO (N/A)  Patient location during evaluation: PACU Anesthesia Type: General Level of consciousness: awake and alert Pain management: pain level controlled Vital Signs Assessment: post-procedure vital signs reviewed and stable Respiratory status: spontaneous breathing, nonlabored ventilation, respiratory function stable and patient connected to nasal cannula oxygen Cardiovascular status: blood pressure returned to baseline and stable Postop Assessment: no signs of nausea or vomiting Anesthetic complications: no    Last Vitals:  Filed Vitals:   11/06/15 0818 11/06/15 0830  BP: 109/67 101/62  Pulse: 77 68  Temp: 36.4 C   Resp: 12 14    Last Pain: There were no vitals filed for this visit.               Chalyn Amescua S

## 2015-11-06 NOTE — H&P (Signed)
Vicki Alexander is an 55 y.o. female MW P2 (93 and 72 yo kids) here today for midurethral sling and cystoscopy due to new onset GSUI. She wears pads. She denies urge incontinence.    No LMP recorded. LMP about 4 months ago    Past Medical History  Diagnosis Date  . GERD (gastroesophageal reflux disease)   . Migraine   . Scoliosis   . Mitral valve prolapse   . Urgency incontinence   . Urinary frequency   . Stress incontinence   . Anemia     ferritin stores low  . Back pain   . PONV (postoperative nausea and vomiting)   . Vaginal delivery 1993, 1997    Past Surgical History  Procedure Laterality Date  . Upper gastrointestinal endoscopy    . Breast biopsy Bilateral     times three, implants    Family History  Problem Relation Age of Onset  . Cancer Mother     breast and ovarian  . Stroke Father   . Diabetes Father   . Cancer Maternal Aunt     breast  . Stroke Maternal Grandmother   . Heart disease Maternal Grandmother   . Diabetes Maternal Grandmother   . Cancer Maternal Grandfather     colon  . Stroke Maternal Grandfather   . Heart disease Maternal Grandfather   . Diabetes Maternal Grandfather   . Stroke Paternal Grandmother   . Heart disease Paternal Grandmother   . Diabetes Paternal Grandmother   . Stroke Paternal Grandfather   . Heart disease Paternal Grandfather   . Diabetes Paternal Grandfather     Social History:  reports that she has never smoked. She has never used smokeless tobacco. She reports that she drinks alcohol. She reports that she does not use illicit drugs.  Allergies:  Allergies  Allergen Reactions  . Bee Venom Anaphylaxis    Prescriptions prior to admission  Medication Sig Dispense Refill Last Dose  . eletriptan (RELPAX) 40 MG tablet Take 1 tablet (40 mg total) by mouth as needed for migraine or headache. May repeat in 2 hours if headache persists or recurs. 10 tablet 6 Past Month at Unknown time  . EPINEPHrine (EPIPEN 2-PAK) 0.3 mg/0.3  mL IJ SOAJ injection Inject 0.3 mLs (0.3 mg total) into the muscle once. (Patient taking differently: Inject 0.3 mg into the muscle once as needed (for emergency/rescue allergic reactions). ) 1 Device 0 Taking  . esomeprazole (NEXIUM) 20 MG capsule Take 40 mg by mouth daily at 12 noon.    11/06/2015 at Unknown time  . gabapentin (NEURONTIN) 800 MG tablet Take 1 tablet (800 mg total) by mouth daily. (Patient taking differently: Take 800 mg by mouth at bedtime. ) 30 tablet 6 11/05/2015 at Unknown time  . Multiple Vitamin (MULTIVITAMIN WITH MINERALS) TABS tablet Take 1 tablet by mouth daily.   Past Week at Unknown time    ROS  Blood pressure 100/64, pulse 88, temperature 97.8 F (36.6 C), temperature source Oral, resp. rate 16, SpO2 98 %. Physical Exam  Heart- rrr Lungs- CTAB Abd- benign  Results for orders placed or performed during the hospital encounter of 11/06/15 (from the past 24 hour(s))  Pregnancy, urine     Status: None   Collection Time: 11/06/15  6:00 AM  Result Value Ref Range   Preg Test, Ur NEGATIVE NEGATIVE    No results found.  Assessment/Plan: GSUI- plan for sling with cystoscopy. We discussed the 5% risk of inducing urge incontinence.  She  understands the risks of surgery, including, but not to infection, bleeding, DVTs, damage to bowel, bladder, ureters. She wishes to proceed.     Cassady Turano C. 11/06/2015, 7:17 AM

## 2015-11-06 NOTE — Anesthesia Preprocedure Evaluation (Signed)
Anesthesia Evaluation  Patient identified by MRN, date of birth, ID band Patient awake    Reviewed: Allergy & Precautions, NPO status , Patient's Chart, lab work & pertinent test results  Airway Mallampati: II  TM Distance: >3 FB Neck ROM: Full    Dental no notable dental hx.    Pulmonary neg pulmonary ROS,    Pulmonary exam normal breath sounds clear to auscultation       Cardiovascular negative cardio ROS Normal cardiovascular exam Rhythm:Regular Rate:Normal     Neuro/Psych negative neurological ROS  negative psych ROS   GI/Hepatic Neg liver ROS, GERD  Medicated,  Endo/Other  negative endocrine ROS  Renal/GU negative Renal ROS  negative genitourinary   Musculoskeletal negative musculoskeletal ROS (+)   Abdominal   Peds negative pediatric ROS (+)  Hematology negative hematology ROS (+)   Anesthesia Other Findings   Reproductive/Obstetrics negative OB ROS                             Anesthesia Physical Anesthesia Plan  ASA: II  Anesthesia Plan: General   Post-op Pain Management:    Induction: Intravenous  Airway Management Planned: LMA  Additional Equipment:   Intra-op Plan:   Post-operative Plan: Extubation in OR  Informed Consent: I have reviewed the patients History and Physical, chart, labs and discussed the procedure including the risks, benefits and alternatives for the proposed anesthesia with the patient or authorized representative who has indicated his/her understanding and acceptance.   Dental advisory given  Plan Discussed with: CRNA and Surgeon  Anesthesia Plan Comments:         Anesthesia Quick Evaluation

## 2015-11-06 NOTE — Anesthesia Procedure Notes (Signed)
Procedure Name: LMA Insertion Date/Time: 11/06/2015 7:42 AM Performed by: Talbot Grumbling Pre-anesthesia Checklist: Patient identified, Emergency Drugs available, Suction available and Patient being monitored Patient Re-evaluated:Patient Re-evaluated prior to inductionOxygen Delivery Method: Circle system utilized Preoxygenation: Pre-oxygenation with 100% oxygen Intubation Type: IV induction Ventilation: Mask ventilation without difficulty LMA: LMA inserted LMA Size: 3.0 Number of attempts: 1 Placement Confirmation: positive ETCO2 and breath sounds checked- equal and bilateral Tube secured with: Tape Dental Injury: Teeth and Oropharynx as per pre-operative assessment

## 2015-11-06 NOTE — Discharge Instructions (Signed)
Urethral Vaginal Sling, Care After Refer to this sheet in the next few weeks. These instructions provide you with information on caring for yourself after your procedure. Your health care provider may also give you more specific instructions. Your treatment has been planned according to current medical practices, but problems sometimes occur. Call your health care provider if you have any problems or questions after your procedure.  WHAT TO EXPECT AFTER THE PROCEDURE  After your procedure, it is typical to have the following:  A catheter in your bladder until your bladder is able to work on its own properly. You will be instructed on how to empty the catheter bag.  Absorbable stitches in your incisions. They will slowly dissolve over 1-2 months. HOME CARE INSTRUCTIONS  Get plenty of rest.  Only take over-the-counter or prescription medicines as directed by your health care provider. Do not take aspirin because it can cause bleeding.  Do not take baths. Take showers until your health care provider tells you otherwise.  You may resume your usual diet. Eat a well-balanced diet.  Drink enough fluids to keep your urine clear or pale yellow.  Limit exercise and activities as directed by your health care provider. Do not lift anything heavier than 5 pounds (2.3 kg).  Do not douche, use tampons, or have sexual intercourse for 6 weeks after your procedure.  Follow up with your health care provider as directed. SEEK MEDICAL CARE IF:  You have a heavy or bad smelling vaginal discharge.   You have a rash.   You have pain that is not controlled with medicines.   You have lightheadedness or feel faint.  SEEK IMMEDIATE MEDICAL CARE IF:  You have a fever.   You have vaginal bleeding.   You faint.   You have shortness of breath.   You have chest, abdominal, or leg pain.   You have pain when urinating or cannot urinate.   Your catheter is still in your bladder and becomes  blocked.   You have swelling, redness, and pain in the vaginal area.    This information is not intended to replace advice given to you by your health care provider. Make sure you discuss any questions you have with your health care provider.   Document Released: 06/15/2013 Document Reviewed: 06/15/2013 Elsevier Interactive Patient Education [employer].

## 2015-11-07 ENCOUNTER — Encounter (HOSPITAL_COMMUNITY): Payer: Self-pay | Admitting: Obstetrics & Gynecology

## 2015-12-24 ENCOUNTER — Ambulatory Visit (INDEPENDENT_AMBULATORY_CARE_PROVIDER_SITE_OTHER): Payer: 59 | Admitting: Obstetrics & Gynecology

## 2015-12-24 ENCOUNTER — Encounter: Payer: Self-pay | Admitting: Obstetrics & Gynecology

## 2015-12-24 VITALS — BP 110/64 | HR 91 | Ht 64.0 in | Wt 116.0 lb

## 2015-12-24 DIAGNOSIS — Z9889 Other specified postprocedural states: Secondary | ICD-10-CM

## 2015-12-24 MED ORDER — ESCITALOPRAM OXALATE 10 MG PO TABS: 10.0000 mg | ORAL_TABLET | Freq: Every day | ORAL | Status: DC

## 2015-12-24 MED FILL — ESCITALOPRAM 10 MG TABLET: 10 | 90 days supply | Qty: 90 | Fill #0

## 2015-12-24 MED FILL — GABAPENTIN 800 MG TABLET: 800 | 30 days supply | Qty: 30 | Fill #3

## 2015-12-24 NOTE — Progress Notes (Signed)
   Subjective:    Patient ID: Vicki Alexander, female    DOB: 01-Nov-1960, 55 y.o.   MRN: [id number]  HPI  55 yo MW lady here for a 6 week post op visit after having a TVT Exact mid urethral sling and cystoscopy. She is extremely happy with her results. She has been having sex with no dyspareunia.  Her only complaint is that of worsening hot flashes. She declines estrogen as her mother had breast cancer. She has some difficulty with insomnia and has tried doxepin.  Review of Systems     Objective:   Physical Exam [id number] Breathing, conversing, and ambulating normally Abd- benign Vagina- healed well      Assessment & Plan:  GSUI- cured with sling Hot flashes-lexapro 10 mg qhs

## 2016-11-05 ENCOUNTER — Other Ambulatory Visit (HOSPITAL_COMMUNITY): Payer: Self-pay | Admitting: Obstetrics & Gynecology

## 2016-11-05 ENCOUNTER — Other Ambulatory Visit: Payer: Self-pay | Admitting: Neurology

## 2016-11-05 DIAGNOSIS — Z0000 Encounter for general adult medical examination without abnormal findings: Secondary | ICD-10-CM

## 2016-11-06 ENCOUNTER — Ambulatory Visit (HOSPITAL_BASED_OUTPATIENT_CLINIC_OR_DEPARTMENT_OTHER)
Admission: RE | Admit: 2016-11-06 | Discharge: 2016-11-06 | Disposition: A | Discharge status: Home / Self Care | Payer: 59 | Source: Ambulatory Visit | Attending: Obstetrics & Gynecology | Admitting: Obstetrics & Gynecology

## 2016-11-06 ENCOUNTER — Encounter (HOSPITAL_BASED_OUTPATIENT_CLINIC_OR_DEPARTMENT_OTHER): Payer: Self-pay

## 2016-11-06 DIAGNOSIS — Z0000 Encounter for general adult medical examination without abnormal findings: Secondary | ICD-10-CM | POA: Diagnosis not present

## 2016-11-06 DIAGNOSIS — Z1231 Encounter for screening mammogram for malignant neoplasm of breast: Secondary | ICD-10-CM | POA: Diagnosis not present

## 2017-01-19 DIAGNOSIS — Z1283 Encounter for screening for malignant neoplasm of skin: Secondary | ICD-10-CM | POA: Diagnosis not present

## 2017-01-19 DIAGNOSIS — L821 Other seborrheic keratosis: Secondary | ICD-10-CM | POA: Diagnosis not present

## 2017-01-19 DIAGNOSIS — L570 Actinic keratosis: Secondary | ICD-10-CM | POA: Diagnosis not present

## 2017-01-19 DIAGNOSIS — X32XXXD Exposure to sunlight, subsequent encounter: Secondary | ICD-10-CM | POA: Diagnosis not present

## 2017-03-11 ENCOUNTER — Encounter (HOSPITAL_COMMUNITY): Payer: Self-pay | Admitting: Emergency Medicine

## 2017-03-11 ENCOUNTER — Ambulatory Visit (HOSPITAL_COMMUNITY)
Admission: EM | Admit: 2017-03-11 | Discharge: 2017-03-11 | Disposition: A | Discharge status: Home / Self Care | Payer: 59 | Attending: Family Medicine | Admitting: Family Medicine

## 2017-03-11 DIAGNOSIS — S61031A Puncture wound without foreign body of right thumb without damage to nail, initial encounter: Secondary | ICD-10-CM

## 2017-03-11 DIAGNOSIS — T148XXA Other injury of unspecified body region, initial encounter: Secondary | ICD-10-CM | POA: Diagnosis not present

## 2017-03-11 DIAGNOSIS — W268XXA Contact with other sharp object(s), not elsewhere classified, initial encounter: Secondary | ICD-10-CM

## 2017-03-11 DIAGNOSIS — Z23 Encounter for immunization: Secondary | ICD-10-CM

## 2017-03-11 MED ORDER — TETANUS-DIPHTH-ACELL PERTUSSIS 5-2.5-18.5 LF-MCG/0.5 IM SUSP
INTRAMUSCULAR | Status: AC
  Filled 2017-03-11: qty 0.5

## 2017-03-11 MED ORDER — TETANUS-DIPHTH-ACELL PERTUSSIS 5-2.5-18.5 LF-MCG/0.5 IM SUSP
0.5000 mL | Freq: Once | INTRAMUSCULAR | Status: AC
  Administered 2017-03-11: 0.5 mL via INTRAMUSCULAR
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
## 2017-03-11 NOTE — ED Provider Notes (Signed)
CSN: [id number]     Arrival date & time 03/11/17  1353 History   First MD Initiated Contact with Patient 03/11/17 1415     Chief Complaint  Patient presents with  . Puncture Wound   (Consider location/radiation/quality/duration/timing/severity/associated sxs/prior Treatment) Patient is here today wanting a tetanus booster vaccine. She reports a minor puncture wound to right thumb secondary to [employer]. She states the spear was kind of rusty.       Past Medical History:  Diagnosis Date  . Anemia    ferritin stores low  . Back pain   . GERD (gastroesophageal reflux disease)   . Migraine   . Mitral valve prolapse   . PONV (postoperative nausea and vomiting)   . Scoliosis   . Stress incontinence   . Urgency incontinence   . Urinary frequency   . Vaginal delivery 1993, 1997   Past Surgical History:  Procedure Laterality Date  . AUGMENTATION MAMMAPLASTY    . BREAST BIOPSY Bilateral    times three, implants 2 left 1 right   . CYSTO N/A 11/06/2015   Procedure: CYSTO;  Surgeon: Emily Filbert, MD;  Location: [facility];  Service: Gynecology;  Laterality: N/A;  . PUBOVAGINAL SLING N/A 11/06/2015   Procedure: MID D[place] Bussing;  Surgeon: Emily Filbert, MD;  Location: [facility];  Service: Gynecology;  Laterality: N/A;  . UPPER GASTROINTESTINAL ENDOSCOPY     Family History  Problem Relation Age of Onset  . Cancer Mother        breast and ovarian  . Stroke Father   . Diabetes Father   . Cancer Maternal Aunt        breast  . Stroke Maternal Grandmother   . Heart disease Maternal Grandmother   . Diabetes Maternal Grandmother   . Cancer Maternal Grandfather        colon  . Stroke Maternal Grandfather   . Heart disease Maternal Grandfather   . Diabetes Maternal Grandfather   . Stroke Paternal Grandmother   . Heart disease Paternal Grandmother   . Diabetes Paternal Grandmother   . Stroke Paternal Grandfather   . Heart disease Paternal Grandfather   . Diabetes Paternal Grandfather     Social History  Substance Use Topics  . Smoking status: Never Smoker  . Smokeless tobacco: Never Used  . Alcohol use 0.0 oz/week     Comment: occ   OB History    No data available     Review of Systems  Constitutional:       See HPI    Allergies  Bee venom  Home Medications   Prior to Admission medications   Medication Sig Start Date End Date Taking? Authorizing Provider  eletriptan (RELPAX) 40 MG tablet Take 1 tablet (40 mg total) by mouth as needed for migraine or headache. May repeat in 2 hours if headache persists or recurs. 01/30/15   Hoyt Koch, MD  EPINEPHrine (EPIPEN 2-PAK) 0.3 mg/0.3 mL IJ SOAJ injection Inject 0.3 mLs (0.3 mg total) into the muscle once. Patient taking differently: Inject 0.3 mg into the muscle once as needed (for emergency/rescue allergic reactions).  01/30/15   Hoyt Koch, MD  escitalopram (LEXAPRO) 10 MG tablet Take 1 tablet (10 mg total) by mouth daily. 12/24/15   Emily Filbert, MD  esomeprazole (NEXIUM) 20 MG capsule Take 40 mg by mouth daily at 12 noon.     [provider]  gabapentin (NEURONTIN) 800 MG tablet Take 1 tablet (800 mg  total) by mouth daily. Patient taking differently: Take 800 mg by mouth at bedtime.  01/30/15   Hoyt Koch, MD  ibuprofen (ADVIL,MOTRIN) 600 MG tablet Take 1 tablet (600 mg total) by mouth every 6 (six) hours as needed. 11/06/15   Emily Filbert, MD  Multiple Vitamin (MULTIVITAMIN WITH MINERALS) TABS tablet Take 1 tablet by mouth daily.    [provider]   Meds Ordered and Administered this Visit   Medications  Tdap (BOOSTRIX) injection 0.5 mL (not administered)    BP 111/62 (BP Location: Left Arm)   Pulse 77   Temp 97.8 F (36.6 C) (Oral)   Resp 16   SpO2 99%  No data found.  Physical Exam  Constitutional: She is oriented to person, place, and time. She appears well-developed and well-nourished.  Cardiovascular: Normal rate, regular rhythm and normal heart  sounds.   Pulmonary/Chest: Effort normal and breath sounds normal. She has no wheezes.  Neurological: She is alert and oriented to person, place, and time.  Skin:  Has a superficial minor puncture wound to right thumb, no bleeding, no swelling, no redness, no pain.   Nursing note and vitals reviewed.   Urgent Care Course     Procedures (including critical care time)  Labs Review Labs Reviewed - No data to display  Imaging Review No results found.   MDM   1. Puncture wound    Wound looks unremarkable. Wound care discussed. Tdap given today. Return as needed   Barry Dienes, NP 03/11/17 1426

## 2017-04-27 ENCOUNTER — Other Ambulatory Visit: Payer: 59 | Admitting: Internal Medicine

## 2017-04-27 DIAGNOSIS — Z130 Encounter for screening for diseases of the blood and blood-forming organs and certain disorders involving the immune mechanism: Secondary | ICD-10-CM

## 2017-04-27 DIAGNOSIS — Z1321 Encounter for screening for nutritional disorder: Secondary | ICD-10-CM | POA: Diagnosis not present

## 2017-04-27 DIAGNOSIS — Z13220 Encounter for screening for lipoid disorders: Secondary | ICD-10-CM

## 2017-04-27 DIAGNOSIS — Z0000 Encounter for general adult medical examination without abnormal findings: Secondary | ICD-10-CM

## 2017-04-27 DIAGNOSIS — Z1329 Encounter for screening for other suspected endocrine disorder: Secondary | ICD-10-CM

## 2017-04-27 LAB — CBC WITH DIFFERENTIAL/PLATELET
BASOS ABS: 0 {cells}/uL (ref 0–200)
Basophils Relative: 0 %
EOS ABS: 240 {cells}/uL (ref 15–500)
EOS PCT: 6 %
HCT: 40.7 % (ref 35.0–45.0)
HEMOGLOBIN: 13.5 g/dL (ref 11.7–15.5)
Lymphocytes Relative: 56 %
Lymphs Abs: 2240 cells/uL (ref 850–3900)
MCH: 30.7 pg (ref 27.0–33.0)
MCHC: 33.2 g/dL (ref 32.0–36.0)
MCV: 92.5 fL (ref 80.0–100.0)
MPV: 10.4 fL (ref 7.5–12.5)
Monocytes Absolute: 320 cells/uL (ref 200–950)
Monocytes Relative: 8 %
NEUTROS ABS: 1200 {cells}/uL — AB (ref 1500–7800)
NEUTROS PCT: 30 %
Platelets: 261 10*3/uL (ref 140–400)
RBC: 4.4 MIL/uL (ref 3.80–5.10)
RDW: 13.7 % (ref 11.0–15.0)
WBC: 4 10*3/uL (ref 3.8–10.8)

## 2017-04-27 LAB — TSH: TSH: 1.24 m[IU]/L

## 2017-04-28 ENCOUNTER — Ambulatory Visit (INDEPENDENT_AMBULATORY_CARE_PROVIDER_SITE_OTHER): Payer: 59 | Admitting: Internal Medicine

## 2017-04-28 ENCOUNTER — Encounter: Payer: Self-pay | Admitting: Internal Medicine

## 2017-04-28 VITALS — BP 92/66 | HR 76 | Ht 64.25 in | Wt 117.0 lb

## 2017-04-28 DIAGNOSIS — Z0000 Encounter for general adult medical examination without abnormal findings: Secondary | ICD-10-CM | POA: Diagnosis not present

## 2017-04-28 DIAGNOSIS — R232 Flushing: Secondary | ICD-10-CM | POA: Diagnosis not present

## 2017-04-28 DIAGNOSIS — G4700 Insomnia, unspecified: Secondary | ICD-10-CM

## 2017-04-28 DIAGNOSIS — Z8669 Personal history of other diseases of the nervous system and sense organs: Secondary | ICD-10-CM | POA: Diagnosis not present

## 2017-04-28 DIAGNOSIS — E559 Vitamin D deficiency, unspecified: Secondary | ICD-10-CM | POA: Diagnosis not present

## 2017-04-28 DIAGNOSIS — Z803 Family history of malignant neoplasm of breast: Secondary | ICD-10-CM

## 2017-04-28 DIAGNOSIS — Z23 Encounter for immunization: Secondary | ICD-10-CM | POA: Diagnosis not present

## 2017-04-28 LAB — COMPLETE METABOLIC PANEL WITH GFR
ALBUMIN: 4.1 g/dL (ref 3.6–5.1)
ALK PHOS: 80 U/L (ref 33–130)
ALT: 10 U/L (ref 6–29)
AST: 15 U/L (ref 10–35)
BILIRUBIN TOTAL: 0.5 mg/dL (ref 0.2–1.2)
BUN: 13 mg/dL (ref 7–25)
CALCIUM: 9.6 mg/dL (ref 8.6–10.4)
CO2: 25 mmol/L (ref 20–32)
Chloride: 104 mmol/L (ref 98–110)
Creat: 0.79 mg/dL (ref 0.50–1.05)
GFR, EST NON AFRICAN AMERICAN: 85 mL/min (ref >=60)
Glucose, Bld: 84 mg/dL (ref 65–99)
Potassium: 4.3 mmol/L (ref 3.5–5.3)
Sodium: 143 mmol/L (ref 135–146)
TOTAL PROTEIN: 6.3 g/dL (ref 6.1–8.1)

## 2017-04-28 LAB — POCT URINALYSIS DIPSTICK
Bilirubin, UA: NEGATIVE
Glucose, UA: NEGATIVE
Ketones, UA: NEGATIVE
Leukocytes, UA: NEGATIVE
NITRITE UA: NEGATIVE
PH UA: 6 (ref 5.0–8.0)
Protein, UA: NEGATIVE
RBC UA: NEGATIVE
SPEC GRAV UA: 1.01 (ref 1.010–1.025)
UROBILINOGEN UA: 0.2 U/dL

## 2017-04-28 LAB — LIPID PANEL
CHOL/HDL RATIO: 2.8 ratio (ref <5.0)
CHOLESTEROL: 174 mg/dL (ref <200)
HDL: 63 mg/dL (ref >50)
LDL Cholesterol: 94 mg/dL (ref <100)
TRIGLYCERIDES: 84 mg/dL (ref <150)
VLDL: 17 mg/dL (ref <30)

## 2017-04-28 LAB — VITAMIN D 25 HYDROXY (VIT D DEFICIENCY, FRACTURES): Vit D, 25-Hydroxy: 23 ng/mL — ABNORMAL LOW (ref 30–100)

## 2017-04-28 MED ORDER — VENLAFAXINE HCL ER 75 MG PO CP24: 75.0000 mg | ORAL_CAPSULE | Freq: Every day | ORAL | 1 refills | Status: DC

## 2017-04-28 MED FILL — VENLAFAXINE HCL ER 75 MG CA: 75 | 30 days supply | Qty: 30 | Fill #0

## 2017-04-28 NOTE — Progress Notes (Signed)
Subjective:    Patient ID: Vicki Alexander, female    DOB: 11/06/1960, 56 y.o.   MRN: [id number]  HPI   First visit for this pleasant 56 year old White Female in today for health maintenance exam and evaluation of medical issues.  She has no history of serious illnesses requiring hospitalization. No operations or fractures. No known drug allergies.  History of scoliosis and used to take gabapentin.  She has no dental will 2010 with precancerous polyps and osteopenia.  She had last tetanus immunization 03/11/2017 at [facility]  Has seen Dr. Larey Days for OB/GYN care about a year ago.  Social history: Her husband as a Garment/textile technologist with [employer]. 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 She has a Scientist, water quality. She does not smoke. Rarely drinks alcohol. Exercises 3 times a week. 2 children a son age 55 and a daughter age 26 both of whom are in good health  Family history: Father died at 63 of lung cancer. Mother age 63 with history of breast cancer at age 26 and hyperthyroidism. Father with history of diabetes, kidney stone and stroke. Breast cancer in maternal grandmother. Diabetes in maternal grandmother, maternal uncle, maternal aunt. Colon cancer in maternal grandfather.  History of vaginal sling for stress urinary incontinence February 2017 by Dr. Hulan Fray  Patient had breast augmentation at 56 years of age and subsequently has had 3 breast biopsies which have been negative. Menarche was at age 31. Had colonoscopy in 2012.  Review of Systems issues with night sweats and insomnia. Currently no pain from scoliosis.History of migraine headaches treated with Relpax.  Lab work reviewed with her. She has mildly decreased vitamin D at 23. Recommend 2000 units vitamin D 3 daily. Urine specimen by dipstick is normal. TSH is normal. Lipid panel is normal. Complete metabolic panel is normal. CBC is within  normal limits.  Had normal Pap smear by Dr. Hulan Fray in 2016     Objective:   Physical Exam  Constitutional: She appears well-developed and well-nourished. No distress.  HENT:  Head: Normocephalic and atraumatic.  Right Ear: External ear normal.  Left Ear: External ear normal.  Mouth/Throat: Oropharynx is clear and moist. No oropharyngeal exudate.  Eyes: Pupils are equal, round, and reactive to light. Conjunctivae and EOM are normal. Right eye exhibits no discharge. Left eye exhibits no discharge. No scleral icterus.  Neck: Neck supple. No JVD present. No thyromegaly present.  Cardiovascular: Normal rate, regular rhythm and normal heart sounds.   No murmur heard. Pulmonary/Chest: Effort normal and breath sounds normal. No respiratory distress. She has no wheezes. She has no rales.  Bilateral breast implants  Abdominal: Soft. Bowel sounds are normal. She exhibits no distension and no mass. There is no tenderness. There is no rebound and no guarding.  Genitourinary:  Genitourinary Comments: Deferred to GYN  Musculoskeletal: She exhibits no edema.  Lymphadenopathy:    She has no cervical adenopathy.  Skin: Skin is warm and dry. No rash noted. She is not diaphoretic.  Psychiatric: She has a normal mood and affect. Her behavior is normal. Thought content normal.  Vitals reviewed.         Assessment & Plan:  History of migraine headaches  Insomnia  Night sweats due to menopause-trial of Effexor 75 mg XR  daily  Family history of breast cancer in mother and maternal grandmother  History of scoliosis  History of vaginal sling for urinary incontinence.  History of GE reflux  History of breast biopsies all benign  History of breast augmentation  Mild vitamin D deficiency  Plan: Trial of Effexor. Return in one year or as needed. Vitamin D 2000 units daily. Recommend annual mammogram. Recommend bone density study. Patient to check and see if colonoscopy is up-to-date.

## 2017-05-06 DIAGNOSIS — R232 Flushing: Secondary | ICD-10-CM | POA: Insufficient documentation

## 2017-05-06 DIAGNOSIS — G4700 Insomnia, unspecified: Secondary | ICD-10-CM | POA: Insufficient documentation

## 2017-05-06 NOTE — Patient Instructions (Signed)
It was a pleasure to see you today. Trial of Effexor. Take 2000 units vitamin D 3 daily. Return in one year or as needed. Flu vaccine given.

## 2017-05-27 MED FILL — VENLAFAXINE HCL ER 75 MG CA: 75 | 30 days supply | Qty: 30 | Fill #1

## 2017-07-01 ENCOUNTER — Telehealth: Payer: Self-pay | Admitting: Internal Medicine

## 2017-07-01 NOTE — Telephone Encounter (Signed)
Patient is calling for a refill on her Effexor 75 mg.  States that she had tried [employer] before, but this one has REALLY worked well for the hot flashes at night.  She is so appreciative.    [facility] [place] # for Contact:  [phone]  Thank you.

## 2017-07-02 MED ORDER — VENLAFAXINE HCL ER 75 MG PO CP24: 75.0000 mg | ORAL_CAPSULE | Freq: Every day | ORAL | 3 refills | Status: DC

## 2017-07-02 MED FILL — VENLAFAXINE HCL ER 75 MG CA: 75 | 90 days supply | Qty: 90 | Fill #0

## 2017-07-02 NOTE — Telephone Encounter (Signed)
Refill x one year °

## 2017-07-02 NOTE — Telephone Encounter (Signed)
Pt is aware.  

## 2017-09-10 MED FILL — PENICILLIN VK 500 MG TABLET: 500 | 10 days supply | Qty: 30 | Fill #0

## 2017-10-05 DIAGNOSIS — L738 Other specified follicular disorders: Secondary | ICD-10-CM | POA: Diagnosis not present

## 2017-10-05 DIAGNOSIS — Z1283 Encounter for screening for malignant neoplasm of skin: Secondary | ICD-10-CM | POA: Diagnosis not present

## 2018-07-05 DIAGNOSIS — L820 Inflamed seborrheic keratosis: Secondary | ICD-10-CM | POA: Diagnosis not present

## 2018-07-05 DIAGNOSIS — D225 Melanocytic nevi of trunk: Secondary | ICD-10-CM | POA: Diagnosis not present

## 2018-07-05 DIAGNOSIS — Z1283 Encounter for screening for malignant neoplasm of skin: Secondary | ICD-10-CM | POA: Diagnosis not present

## 2018-08-19 ENCOUNTER — Other Ambulatory Visit (HOSPITAL_COMMUNITY): Payer: Self-pay | Admitting: Obstetrics & Gynecology

## 2018-08-19 DIAGNOSIS — Z1239 Encounter for other screening for malignant neoplasm of breast: Secondary | ICD-10-CM

## 2018-08-20 ENCOUNTER — Other Ambulatory Visit: Payer: Self-pay | Admitting: Neurology

## 2018-08-20 ENCOUNTER — Ambulatory Visit (INDEPENDENT_AMBULATORY_CARE_PROVIDER_SITE_OTHER): Payer: 59

## 2018-08-20 DIAGNOSIS — Z1239 Encounter for other screening for malignant neoplasm of breast: Secondary | ICD-10-CM

## 2018-08-20 DIAGNOSIS — Z1231 Encounter for screening mammogram for malignant neoplasm of breast: Secondary | ICD-10-CM | POA: Diagnosis not present

## 2018-10-08 ENCOUNTER — Other Ambulatory Visit: Payer: 59 | Admitting: Internal Medicine

## 2018-10-08 DIAGNOSIS — Z0000 Encounter for general adult medical examination without abnormal findings: Secondary | ICD-10-CM | POA: Diagnosis not present

## 2018-10-08 DIAGNOSIS — G4700 Insomnia, unspecified: Secondary | ICD-10-CM | POA: Diagnosis not present

## 2018-10-08 DIAGNOSIS — Z803 Family history of malignant neoplasm of breast: Secondary | ICD-10-CM

## 2018-10-08 DIAGNOSIS — R232 Flushing: Secondary | ICD-10-CM

## 2018-10-08 DIAGNOSIS — E559 Vitamin D deficiency, unspecified: Secondary | ICD-10-CM

## 2018-10-08 NOTE — Progress Notes (Signed)
Labs drawn and flu vaccine given

## 2018-10-09 LAB — COMPLETE METABOLIC PANEL WITH GFR
AG Ratio: 2 (calc) (ref 1.0–2.5)
ALT: 12 U/L (ref 6–29)
AST: 18 U/L (ref 10–35)
Albumin: 4.5 g/dL (ref 3.6–5.1)
Alkaline phosphatase (APISO): 84 U/L (ref 33–130)
BUN: 12 mg/dL (ref 7–25)
CHLORIDE: 102 mmol/L (ref 98–110)
CO2: 30 mmol/L (ref 20–32)
Calcium: 10 mg/dL (ref 8.6–10.4)
Creat: 0.75 mg/dL (ref 0.50–1.05)
GFR, EST AFRICAN AMERICAN: 103 mL/min/{1.73_m2} (ref >=60)
GFR, Est Non African American: 89 mL/min/{1.73_m2} (ref >=60)
GLUCOSE: 77 mg/dL (ref 65–99)
Globulin: 2.3 g/dL (calc) (ref 1.9–3.7)
Potassium: 4.3 mmol/L (ref 3.5–5.3)
Sodium: 140 mmol/L (ref 135–146)
TOTAL PROTEIN: 6.8 g/dL (ref 6.1–8.1)
Total Bilirubin: 0.4 mg/dL (ref 0.2–1.2)

## 2018-10-09 LAB — LIPID PANEL
CHOLESTEROL: 182 mg/dL (ref <200)
HDL: 75 mg/dL (ref >50)
LDL CHOLESTEROL (CALC): 93 mg/dL
Non-HDL Cholesterol (Calc): 107 mg/dL (calc) (ref <130)
TRIGLYCERIDES: 53 mg/dL (ref <150)
Total CHOL/HDL Ratio: 2.4 (calc) (ref <5.0)

## 2018-10-09 LAB — CBC WITH DIFFERENTIAL/PLATELET
Absolute Monocytes: 448 {cells}/uL (ref 200–950)
Basophils Absolute: 7 {cells}/uL (ref 0–200)
Basophils Relative: 0.1 %
Eosinophils Absolute: 238 {cells}/uL (ref 15–500)
Eosinophils Relative: 3.4 %
HCT: 42.1 % (ref 35.0–45.0)
Hemoglobin: 14.1 g/dL (ref 11.7–15.5)
Lymphs Abs: 1841 {cells}/uL (ref 850–3900)
MCH: 29.9 pg (ref 27.0–33.0)
MCHC: 33.5 g/dL (ref 32.0–36.0)
MCV: 89.2 fL (ref 80.0–100.0)
MPV: 11.2 fL (ref 7.5–12.5)
Monocytes Relative: 6.4 %
Neutro Abs: 4466 {cells}/uL (ref 1500–7800)
Neutrophils Relative %: 63.8 %
Platelets: 270 Thousand/uL (ref 140–400)
RBC: 4.72 Million/uL (ref 3.80–5.10)
RDW: 12.7 % (ref 11.0–15.0)
Total Lymphocyte: 26.3 %
WBC: 7 Thousand/uL (ref 3.8–10.8)

## 2018-10-09 LAB — TSH: TSH: 1.34 m[IU]/L (ref 0.40–4.50)

## 2018-10-09 LAB — VITAMIN D 25 HYDROXY (VIT D DEFICIENCY, FRACTURES): Vit D, 25-Hydroxy: 23 ng/mL — ABNORMAL LOW (ref 30–100)

## 2018-10-28 ENCOUNTER — Ambulatory Visit (INDEPENDENT_AMBULATORY_CARE_PROVIDER_SITE_OTHER): Payer: 59 | Admitting: Internal Medicine

## 2018-10-28 ENCOUNTER — Encounter: Payer: Self-pay | Admitting: Internal Medicine

## 2018-10-28 VITALS — BP 100/60 | HR 79 | Temp 98.5°F | Ht 64.24 in | Wt 114.0 lb

## 2018-10-28 DIAGNOSIS — E559 Vitamin D deficiency, unspecified: Secondary | ICD-10-CM | POA: Diagnosis not present

## 2018-10-28 DIAGNOSIS — R8290 Unspecified abnormal findings in urine: Secondary | ICD-10-CM | POA: Diagnosis not present

## 2018-10-28 DIAGNOSIS — Z0000 Encounter for general adult medical examination without abnormal findings: Secondary | ICD-10-CM | POA: Diagnosis not present

## 2018-10-28 DIAGNOSIS — Z803 Family history of malignant neoplasm of breast: Secondary | ICD-10-CM

## 2018-10-28 DIAGNOSIS — Z23 Encounter for immunization: Secondary | ICD-10-CM | POA: Diagnosis not present

## 2018-10-28 DIAGNOSIS — Z8669 Personal history of other diseases of the nervous system and sense organs: Secondary | ICD-10-CM | POA: Diagnosis not present

## 2018-10-28 LAB — POCT URINALYSIS DIPSTICK
Appearance: NEGATIVE
Bilirubin, UA: NEGATIVE
Blood, UA: NEGATIVE
GLUCOSE UA: NEGATIVE
KETONES UA: NEGATIVE
NITRITE UA: NEGATIVE
Odor: NEGATIVE
Protein, UA: NEGATIVE
SPEC GRAV UA: 1.01 (ref 1.010–1.025)
Urobilinogen, UA: 0.2 E.U./dL
pH, UA: 6.5 (ref 5.0–8.0)

## 2018-10-28 NOTE — Progress Notes (Signed)
Subjective:    Patient ID: Vicki Alexander, female    DOB: [birth year], 58 y.o.   MRN: [id number]  HPI 58 year old Female in today for health maintenance exam.  No history of serious illnesses requiring hospitalization.  No operations or fractures.  No known drug allergies.  History of scoliosis and used to take gabapentin.  Tetanus immunization done July 2018 at [facility] urgent care.  Is seeing Dr. Hulan Fray for OB/GYN care in the past.  Had pubovaginal sling February 2017 by Dr. Hulan Fray.  History of stress and urge incontinence  History of benign breast biopsies 2 left breast and 1 right breast  History of bilateral breast augmentation.  History of migraine headaches and mitral valve prolapse.  History of breast cancer in maternal aunt and colon cancer in maternal grandfather.  Mother recently deceased with history of breast cancer at age 65 and ovarian cancer.  Mother also had hyperthyroidism.  Stroke in maternal grandparents and paternal grandparents.  Diabetes in maternal and paternal grandparents.  Diabetes in father.  Heart disease in both sets of grandparents.  Additional family history: Father died at 36 of lung cancer.    Social history: Married.  Social alcohol consumption.  Does not smoke.  2 adult children.  Husband is a neurologist with [employer].  Patient has worked as a Librarian, academic.  Currently on homemaker.  She has a Scientist, water quality. Flu vaccine given today at her request.  Tetanus immunization is up-to-date.  Had colonoscopy in 2012 per past medical history records.  History of GE reflux treated with Nexium and this was refilled.  History of migraine headaches treated with Relpax.  Labs reviewed.  Vitamin D is low at 23.  Recommend over-the-counter supplement. Lipid panel is normal.  CBC and C met are normal.  TSH is normal   Review of Systems her mother died not long ago.  She spent considerable time taking care of her.  She is not sleeping all that well  at the present time.     Objective:   Physical Exam Vitals signs reviewed.  Constitutional:      General: She is not in acute distress.    Appearance: Normal appearance.  HENT:     Head: Normocephalic.     Right Ear: Tympanic membrane normal.     Left Ear: Tympanic membrane normal.     Nose: Nose normal.     Mouth/Throat:     Mouth: Mucous membranes are moist.     Pharynx: Oropharynx is clear.  Eyes:     General: No scleral icterus.       Right eye: No discharge.        Left eye: No discharge.     Extraocular Movements: Extraocular movements intact.     Conjunctiva/sclera: Conjunctivae normal.     Pupils: Pupils are equal, round, and reactive to light.  Neck:     Musculoskeletal: Neck supple. No neck rigidity.     Vascular: No carotid bruit.     Comments: No thyromegaly Cardiovascular:     Rate and Rhythm: Regular rhythm. Tachycardia present.     Pulses: Normal pulses.     Heart sounds: Normal heart sounds. No murmur.     Comments: No masses on breast exam Pulmonary:     Effort: No respiratory distress.     Breath sounds: Normal breath sounds. No wheezing or rales.  Abdominal:     General: Bowel sounds are normal. There is no distension.  Palpations: Abdomen is soft. There is no mass.     Tenderness: There is no rebound.  Musculoskeletal:     Right lower leg: No edema.     Left lower leg: No edema.  Lymphadenopathy:     Cervical: No cervical adenopathy.  Skin:    General: Skin is warm and dry.  Neurological:     General: No focal deficit present.     Mental Status: She is alert and oriented to person, place, and time.  Psychiatric:        Mood and Affect: Mood normal.        Behavior: Behavior normal.        Thought Content: Thought content normal.        Judgment: Judgment normal.           Assessment & Plan:  Normal health maintenance exam  History of migraine headaches  Family history of breast cancer maternal side of family  History of  vaginal sling for urinary incontinence  History of GE reflux  History of benign breast biopsies  History of breast augmentation  Vitamin D deficiency  Plan: Return in 1 year or as needed.  Take vitamin D supplement.

## 2018-10-29 LAB — URINE CULTURE
MICRO NUMBER:: 221240
SPECIMEN QUALITY: ADEQUATE

## 2018-11-06 ENCOUNTER — Encounter: Payer: Self-pay | Admitting: Internal Medicine

## 2018-11-06 NOTE — Patient Instructions (Addendum)
It was a pleasure to see you today.  Return in 1 year or as needed.  Flu vaccine given.  Take vitamin D supplement.

## 2019-02-17 ENCOUNTER — Ambulatory Visit (INDEPENDENT_AMBULATORY_CARE_PROVIDER_SITE_OTHER): Payer: 59 | Admitting: Internal Medicine

## 2019-02-17 ENCOUNTER — Other Ambulatory Visit: Payer: Self-pay

## 2019-02-17 DIAGNOSIS — F4321 Adjustment disorder with depressed mood: Secondary | ICD-10-CM

## 2019-02-17 DIAGNOSIS — R5383 Other fatigue: Secondary | ICD-10-CM

## 2019-02-17 DIAGNOSIS — F329 Major depressive disorder, single episode, unspecified: Secondary | ICD-10-CM | POA: Diagnosis not present

## 2019-02-17 MED ORDER — BUPROPION HCL ER (XL) 150 MG PO TB24: 150.0000 mg | ORAL_TABLET | Freq: Every day | ORAL | 0 refills | Status: DC

## 2019-02-17 MED FILL — buPROPion HCL ER (XL) 150 M: 150 | 90 days supply | Qty: 90 | Fill #0

## 2019-02-17 NOTE — Progress Notes (Signed)
   Subjective:    Patient ID: Vicki Alexander, female    DOB: 23-Nov-1960, 58 y.o.   MRN: [id number]  HPI Pleasant 58 year old Female seen by interactive audio and video telecommunications today due to the coronavirus pandemic.    Her mother passed away earlier this year.  She has been a great deal of time taking care of her mother.  This was a great loss for her.  She feels that she is not adjusting to mother's loss as well as she should.  She has been fatigued.  Not sure if it is due to the pandemic or missing her mother.  She is agreeable to visit in this format today.  She is identified using 2 identifiers as Vicki Alexander, a patient in this practice.    Review of Systems see above     Objective:   Physical Exam  Spent 15 minutes speaking with her about grief reaction, significant loss of her mother and fatigue.  TSH was checked in January and was normal.  Vitamin D was low at that time supplementation was advised.  Vitamin D level was 23.      Assessment & Plan:  Since main complaint is fatigue in addition to agree, we have decided together that she will try Wellbutrin XL 150 mg daily and follow-up in 4 to 6 weeks.  We can increase to 300 mg daily if necessary

## 2019-03-08 ENCOUNTER — Encounter: Payer: Self-pay | Admitting: Internal Medicine

## 2019-03-08 NOTE — Patient Instructions (Signed)
Try Wellbutrin XL 150 mg daily and follow-up in about 6 weeks.

## 2019-04-14 MED FILL — AMOXICILLIN 500 MG CAPSULE: 500 | 10 days supply | Qty: 30 | Fill #0

## 2019-06-22 ENCOUNTER — Encounter: Payer: Self-pay | Admitting: Internal Medicine

## 2019-06-22 ENCOUNTER — Ambulatory Visit (INDEPENDENT_AMBULATORY_CARE_PROVIDER_SITE_OTHER): Payer: 59 | Admitting: Internal Medicine

## 2019-06-22 ENCOUNTER — Other Ambulatory Visit: Payer: Self-pay

## 2019-06-22 DIAGNOSIS — Z23 Encounter for immunization: Secondary | ICD-10-CM

## 2019-06-22 NOTE — Patient Instructions (Signed)
Patient received a flu vaccine IM L deltoid, AV, CMA  

## 2019-06-22 NOTE — Progress Notes (Signed)
Flu vaccine given by CMA 

## 2019-07-25 DIAGNOSIS — Z1283 Encounter for screening for malignant neoplasm of skin: Secondary | ICD-10-CM | POA: Diagnosis not present

## 2019-07-25 DIAGNOSIS — L821 Other seborrheic keratosis: Secondary | ICD-10-CM | POA: Diagnosis not present

## 2019-08-18 DIAGNOSIS — Z800 Family history of malignant neoplasm of digestive organs: Secondary | ICD-10-CM | POA: Diagnosis not present

## 2019-08-18 DIAGNOSIS — Z86010 Personal history of colonic polyps: Secondary | ICD-10-CM | POA: Insufficient documentation

## 2019-08-18 DIAGNOSIS — K219 Gastro-esophageal reflux disease without esophagitis: Secondary | ICD-10-CM | POA: Diagnosis not present

## 2019-08-18 DIAGNOSIS — R1314 Dysphagia, pharyngoesophageal phase: Secondary | ICD-10-CM | POA: Diagnosis not present

## 2019-10-07 DIAGNOSIS — K3189 Other diseases of stomach and duodenum: Secondary | ICD-10-CM | POA: Diagnosis not present

## 2019-10-07 DIAGNOSIS — K2950 Unspecified chronic gastritis without bleeding: Secondary | ICD-10-CM | POA: Diagnosis not present

## 2019-10-07 DIAGNOSIS — K317 Polyp of stomach and duodenum: Secondary | ICD-10-CM | POA: Diagnosis not present

## 2019-10-07 DIAGNOSIS — K2100 Gastro-esophageal reflux disease with esophagitis, without bleeding: Secondary | ICD-10-CM | POA: Diagnosis not present

## 2019-10-07 DIAGNOSIS — K209 Esophagitis, unspecified without bleeding: Secondary | ICD-10-CM | POA: Diagnosis not present

## 2019-10-07 DIAGNOSIS — K635 Polyp of colon: Secondary | ICD-10-CM | POA: Diagnosis not present

## 2019-10-07 DIAGNOSIS — D123 Benign neoplasm of transverse colon: Secondary | ICD-10-CM | POA: Diagnosis not present

## 2019-10-07 DIAGNOSIS — Z800 Family history of malignant neoplasm of digestive organs: Secondary | ICD-10-CM | POA: Diagnosis not present

## 2019-10-07 DIAGNOSIS — K222 Esophageal obstruction: Secondary | ICD-10-CM | POA: Diagnosis not present

## 2019-10-07 DIAGNOSIS — Z86010 Personal history of colonic polyps: Secondary | ICD-10-CM | POA: Diagnosis not present

## 2019-10-07 DIAGNOSIS — K2930 Chronic superficial gastritis without bleeding: Secondary | ICD-10-CM | POA: Diagnosis not present

## 2019-10-07 DIAGNOSIS — Z1211 Encounter for screening for malignant neoplasm of colon: Secondary | ICD-10-CM | POA: Diagnosis not present

## 2019-10-07 DIAGNOSIS — K5730 Diverticulosis of large intestine without perforation or abscess without bleeding: Secondary | ICD-10-CM | POA: Diagnosis not present

## 2019-11-04 MED FILL — ESOMEPRAZOLE MAG DR 40 MG C: 40 | 90 days supply | Qty: 150 | Fill #0

## 2019-12-28 DIAGNOSIS — Z1283 Encounter for screening for malignant neoplasm of skin: Secondary | ICD-10-CM | POA: Diagnosis not present

## 2019-12-28 DIAGNOSIS — D225 Melanocytic nevi of trunk: Secondary | ICD-10-CM | POA: Diagnosis not present

## 2019-12-28 DIAGNOSIS — L820 Inflamed seborrheic keratosis: Secondary | ICD-10-CM | POA: Diagnosis not present

## 2020-02-22 ENCOUNTER — Other Ambulatory Visit (HOSPITAL_BASED_OUTPATIENT_CLINIC_OR_DEPARTMENT_OTHER): Payer: Self-pay | Admitting: Neurology

## 2020-02-22 DIAGNOSIS — Z1231 Encounter for screening mammogram for malignant neoplasm of breast: Secondary | ICD-10-CM

## 2020-02-23 ENCOUNTER — Ambulatory Visit (HOSPITAL_BASED_OUTPATIENT_CLINIC_OR_DEPARTMENT_OTHER)
Admission: RE | Admit: 2020-02-23 | Discharge: 2020-02-23 | Disposition: A | Discharge status: Home / Self Care | Payer: 59 | Source: Ambulatory Visit | Attending: Neurology | Admitting: Neurology

## 2020-02-23 ENCOUNTER — Other Ambulatory Visit: Payer: Self-pay

## 2020-02-23 DIAGNOSIS — Z1231 Encounter for screening mammogram for malignant neoplasm of breast: Secondary | ICD-10-CM | POA: Diagnosis not present

## 2020-04-02 ENCOUNTER — Other Ambulatory Visit (HOSPITAL_COMMUNITY)
Admission: RE | Admit: 2020-04-02 | Discharge: 2020-04-02 | Disposition: A | Discharge status: Home / Self Care | Payer: 59 | Source: Ambulatory Visit | Attending: Obstetrics & Gynecology | Admitting: Obstetrics & Gynecology

## 2020-04-02 ENCOUNTER — Encounter: Payer: Self-pay | Admitting: Obstetrics & Gynecology

## 2020-04-02 ENCOUNTER — Other Ambulatory Visit: Payer: Self-pay

## 2020-04-02 ENCOUNTER — Ambulatory Visit (INDEPENDENT_AMBULATORY_CARE_PROVIDER_SITE_OTHER): Payer: 59 | Admitting: Obstetrics & Gynecology

## 2020-04-02 VITALS — BP 105/64 | HR 86 | Ht 64.0 in | Wt 120.0 lb

## 2020-04-02 DIAGNOSIS — N951 Menopausal and female climacteric states: Secondary | ICD-10-CM | POA: Diagnosis not present

## 2020-04-02 DIAGNOSIS — Z803 Family history of malignant neoplasm of breast: Secondary | ICD-10-CM | POA: Diagnosis not present

## 2020-04-02 DIAGNOSIS — Z01419 Encounter for gynecological examination (general) (routine) without abnormal findings: Secondary | ICD-10-CM | POA: Insufficient documentation

## 2020-04-02 MED ORDER — GABAPENTIN 100 MG PO CAPS: ORAL_CAPSULE | ORAL | 1 refills | Status: DC

## 2020-04-02 MED FILL — GABAPENTIN 100 MG CAPSULE: 100 | 10 days supply | Qty: 90 | Fill #0

## 2020-04-02 NOTE — Progress Notes (Signed)
Last pap- 08/08/15- negative Last mammogram- 02/23/20-negative

## 2020-04-03 LAB — CYTOLOGY - PAP
Comment: NEGATIVE
Diagnosis: NEGATIVE
High risk HPV: NEGATIVE

## 2020-04-03 NOTE — Progress Notes (Signed)
Subjective:     Vicki Alexander is a 59 y.o. female here for a routine exam.  Current complaints: night sweats.  They were better when she was on Neurontin; would like to go back on Neurontin.  No problems with sling.   Gynecologic History Patient's last menstrual period was 08/29/2015. Contraception: post menopausal status Last Pap: 2017. Results were: normal Last mammogram: 2021. Results were: normal  Obstetric History OB History  Gravida Para Term Preterm AB Living  2 2 2     2   SAB TAB Ectopic Multiple Live Births               # Outcome Date GA Lbr Len/2nd Weight Sex Delivery Anes PTL Lv  2 Term           1 Term              The following portions of the patient's history were reviewed and updated as appropriate: allergies, current medications, past family history, past medical history, past social history, past surgical history and problem list.  Review of Systems Pertinent items noted in HPI and remainder of comprehensive ROS otherwise negative.    Objective:      Vitals:   04/02/20 1027  BP: (!) 105/64  Pulse: 86  Weight: 120 lb (54.4 kg)  Height: 5\' 4"  (1.626 m)   Vitals:  WNL General appearance: alert, cooperative and no distress  HEENT: Normocephalic, without obvious abnormality, atraumatic Eyes: negative Throat: lips, mucosa, and tongue normal; teeth and gums normal  Respiratory: Clear to auscultation bilaterally  CV: Regular rate and rhythm  Breasts:  Normal appearance, no masses or tenderness, no nipple retraction or dimpling  GI: Soft, non-tender; bowel sounds normal; no masses,  no organomegaly  GU: External Genitalia:  Tanner V, no lesion Urethra:  No prolapse   Vagina: Pink, normal rugae, no blood or discharge  Cervix: No CMT, no lesion  Uterus:  Normal size and contour, non tender  Adnexa: Normal, no masses, non tender  Musculoskeletal: No edema, redness or tenderness in the calves or thighs  Skin: No lesions or rash  Lymphatic: Axillary  adenopathy: none     Psychiatric: Normal mood and behavior   Assessment:    Healthy female exam.   Family History of Breast cancer TZ risk = 22% (completed survey in the office)   Plan:    1.  Family history of breast cancer--TZ risk 22%. Offered formal genetic counseling (had My Risk several years ago and negative).  Offered referral to Dr. Donne Hazel to discuss Rx for breast cancer prevention.  Pt declines at this time.  Pt does agree to MRI breast and should qualify given life time risk of breast cancer >20%. 2.  Pap with co testing 3.  Replens for vaginal moisture 4.  Neurontin for hot flashes. 5.  Dr. Renold Genta manages rest of health care maintenance

## 2020-05-08 ENCOUNTER — Other Ambulatory Visit: Payer: 59

## 2020-05-12 ENCOUNTER — Ambulatory Visit
Admission: RE | Admit: 2020-05-12 | Discharge: 2020-05-12 | Disposition: A | Discharge status: Home / Self Care | Payer: 59 | Source: Ambulatory Visit | Attending: Obstetrics & Gynecology | Admitting: Obstetrics & Gynecology

## 2020-05-12 DIAGNOSIS — Z803 Family history of malignant neoplasm of breast: Secondary | ICD-10-CM

## 2020-05-12 DIAGNOSIS — N6489 Other specified disorders of breast: Secondary | ICD-10-CM | POA: Diagnosis not present

## 2020-05-12 MED ORDER — GADOBUTROL 1 MMOL/ML IV SOLN
6.0000 mL | Freq: Once | INTRAVENOUS | Status: AC | PRN
  Administered 2020-05-12: 6 mL via INTRAVENOUS

## 2020-05-16 ENCOUNTER — Other Ambulatory Visit: Payer: Self-pay | Admitting: Obstetrics & Gynecology

## 2020-05-16 ENCOUNTER — Other Ambulatory Visit: Payer: Self-pay | Admitting: *Deleted

## 2020-05-16 DIAGNOSIS — R599 Enlarged lymph nodes, unspecified: Secondary | ICD-10-CM

## 2020-05-16 NOTE — Progress Notes (Signed)
Per VO of Dr Gala Romney PT/CT ordered to be done @ [facility].

## 2020-05-22 ENCOUNTER — Ambulatory Visit (HOSPITAL_COMMUNITY)
Admission: RE | Admit: 2020-05-22 | Discharge: 2020-05-22 | Disposition: A | Discharge status: Home / Self Care | Payer: 59 | Source: Ambulatory Visit | Attending: Obstetrics & Gynecology | Admitting: Obstetrics & Gynecology

## 2020-05-22 ENCOUNTER — Other Ambulatory Visit: Payer: Self-pay

## 2020-05-22 DIAGNOSIS — R590 Localized enlarged lymph nodes: Secondary | ICD-10-CM | POA: Diagnosis not present

## 2020-05-22 DIAGNOSIS — R911 Solitary pulmonary nodule: Secondary | ICD-10-CM | POA: Diagnosis not present

## 2020-05-22 DIAGNOSIS — Z9882 Breast implant status: Secondary | ICD-10-CM | POA: Diagnosis not present

## 2020-05-22 DIAGNOSIS — R599 Enlarged lymph nodes, unspecified: Secondary | ICD-10-CM | POA: Insufficient documentation

## 2020-05-22 LAB — GLUCOSE, CAPILLARY: Glucose-Capillary: 78 mg/dL (ref 70–99)

## 2020-05-22 MED ORDER — FLUDEOXYGLUCOSE F - 18 (FDG) INJECTION
6.1400 | Freq: Once | INTRAVENOUS | Status: AC | PRN
  Administered 2020-05-22: 6.14 via INTRAVENOUS

## 2020-05-24 ENCOUNTER — Telehealth: Payer: Self-pay | Admitting: *Deleted

## 2020-05-24 DIAGNOSIS — R911 Solitary pulmonary nodule: Secondary | ICD-10-CM

## 2020-05-24 NOTE — Telephone Encounter (Signed)
-----   Message from Guss Bunde, MD sent at 05/24/2020  2:53 PM EDT ----- Regarding: lung nodule Can you please place a non contrasat ct order for Vicki Alexander in 6 months to f/u lung nodule.  I sent her a message about htat.  Still waiting to hear back about lymph node.

## 2020-06-07 ENCOUNTER — Other Ambulatory Visit: Payer: Self-pay | Admitting: *Deleted

## 2020-06-07 DIAGNOSIS — R918 Other nonspecific abnormal finding of lung field: Secondary | ICD-10-CM

## 2020-06-08 ENCOUNTER — Other Ambulatory Visit: Payer: Self-pay | Admitting: Obstetrics & Gynecology

## 2020-06-08 DIAGNOSIS — R590 Localized enlarged lymph nodes: Secondary | ICD-10-CM

## 2020-06-08 DIAGNOSIS — Z9189 Other specified personal risk factors, not elsewhere classified: Secondary | ICD-10-CM

## 2020-06-08 MED ORDER — GABAPENTIN 300 MG PO CAPS: ORAL_CAPSULE | ORAL | 3 refills | Status: DC

## 2020-06-08 MED FILL — GABAPENTIN 300 MG CAPSULE: 300 | 90 days supply | Qty: 90 | Fill #0

## 2020-06-08 NOTE — Progress Notes (Signed)
Neurontin 300 mg QHS prescribed.  Pt states it is helping.  Also, offered patient referral to Dr. Donne Hazel given her lifetime risk of breast cancer and internal mammary lymph node found on MR.

## 2020-06-08 NOTE — Progress Notes (Signed)
Pt has lifetime risk of breast cancer >20% and has enlarged internal mammary lymph node on MR.  Referral to Dr. Donne Hazel breast surgeon for consult.

## 2020-06-29 DIAGNOSIS — Z803 Family history of malignant neoplasm of breast: Secondary | ICD-10-CM | POA: Diagnosis not present

## 2020-07-02 ENCOUNTER — Ambulatory Visit (INDEPENDENT_AMBULATORY_CARE_PROVIDER_SITE_OTHER): Payer: 59 | Admitting: Internal Medicine

## 2020-07-02 ENCOUNTER — Other Ambulatory Visit: Payer: Self-pay

## 2020-07-02 ENCOUNTER — Encounter: Payer: Self-pay | Admitting: Internal Medicine

## 2020-07-02 VITALS — BP 110/80 | HR 86 | Temp 98.0°F | Ht 64.0 in | Wt 120.0 lb

## 2020-07-02 DIAGNOSIS — Z23 Encounter for immunization: Secondary | ICD-10-CM

## 2020-07-02 NOTE — Progress Notes (Signed)
   Subjective:    Patient ID: Vicki Alexander, female    DOB: Feb 25, 1961, 59 y.o.   MRN: [id number]  HPI Annual Flu vaccine given today per CMA.    Review of Systems     Objective:   Physical Exam        Assessment & Plan:

## 2020-07-02 NOTE — Patient Instructions (Signed)
Patient received a flu vaccine IM L deltoid, AV, CMA  

## 2020-07-03 ENCOUNTER — Other Ambulatory Visit: Payer: Self-pay | Admitting: General Surgery

## 2020-07-03 DIAGNOSIS — R942 Abnormal results of pulmonary function studies: Secondary | ICD-10-CM

## 2020-07-13 DIAGNOSIS — H04123 Dry eye syndrome of bilateral lacrimal glands: Secondary | ICD-10-CM | POA: Diagnosis not present

## 2020-07-13 DIAGNOSIS — H2513 Age-related nuclear cataract, bilateral: Secondary | ICD-10-CM | POA: Diagnosis not present

## 2020-07-25 DIAGNOSIS — X32XXXD Exposure to sunlight, subsequent encounter: Secondary | ICD-10-CM | POA: Diagnosis not present

## 2020-07-25 DIAGNOSIS — Z1283 Encounter for screening for malignant neoplasm of skin: Secondary | ICD-10-CM | POA: Diagnosis not present

## 2020-07-25 DIAGNOSIS — L821 Other seborrheic keratosis: Secondary | ICD-10-CM | POA: Diagnosis not present

## 2020-07-25 DIAGNOSIS — L820 Inflamed seborrheic keratosis: Secondary | ICD-10-CM | POA: Diagnosis not present

## 2020-07-25 DIAGNOSIS — L570 Actinic keratosis: Secondary | ICD-10-CM | POA: Diagnosis not present

## 2020-07-30 ENCOUNTER — Other Ambulatory Visit: Payer: Self-pay

## 2020-08-03 DIAGNOSIS — Z23 Encounter for immunization: Secondary | ICD-10-CM | POA: Diagnosis not present

## 2020-09-14 ENCOUNTER — Other Ambulatory Visit: Payer: Self-pay | Admitting: General Surgery

## 2020-09-14 DIAGNOSIS — R942 Abnormal results of pulmonary function studies: Secondary | ICD-10-CM

## 2020-10-08 ENCOUNTER — Ambulatory Visit
Admission: RE | Admit: 2020-10-08 | Discharge: 2020-10-08 | Disposition: A | Discharge status: Home / Self Care | Payer: 59 | Source: Ambulatory Visit | Attending: General Surgery | Admitting: General Surgery

## 2020-10-08 ENCOUNTER — Other Ambulatory Visit: Payer: Self-pay

## 2020-10-08 DIAGNOSIS — R911 Solitary pulmonary nodule: Secondary | ICD-10-CM | POA: Diagnosis not present

## 2020-10-08 DIAGNOSIS — E041 Nontoxic single thyroid nodule: Secondary | ICD-10-CM | POA: Diagnosis not present

## 2020-10-08 DIAGNOSIS — K7689 Other specified diseases of liver: Secondary | ICD-10-CM | POA: Diagnosis not present

## 2020-10-08 DIAGNOSIS — R942 Abnormal results of pulmonary function studies: Secondary | ICD-10-CM

## 2020-10-08 DIAGNOSIS — Z9882 Breast implant status: Secondary | ICD-10-CM | POA: Diagnosis not present

## 2020-10-08 MED ORDER — IOPAMIDOL (ISOVUE-300) INJECTION 61%
75.0000 mL | Freq: Once | INTRAVENOUS | Status: AC | PRN
  Administered 2020-10-08: 75 mL via INTRAVENOUS

## 2020-10-22 ENCOUNTER — Ambulatory Visit (INDEPENDENT_AMBULATORY_CARE_PROVIDER_SITE_OTHER): Payer: 59 | Admitting: Internal Medicine

## 2020-10-22 ENCOUNTER — Ambulatory Visit: Payer: 59 | Admitting: Internal Medicine

## 2020-10-22 ENCOUNTER — Other Ambulatory Visit: Payer: Self-pay

## 2020-10-22 ENCOUNTER — Encounter: Payer: Self-pay | Admitting: Internal Medicine

## 2020-10-22 VITALS — BP 108/64 | HR 69 | Ht 64.0 in | Wt 117.2 lb

## 2020-10-22 DIAGNOSIS — R9431 Abnormal electrocardiogram [ECG] [EKG]: Secondary | ICD-10-CM

## 2020-10-22 NOTE — Patient Instructions (Signed)
Medication Instructions:  Your physician recommends that you continue on your current medications as directed. Please refer to the Current Medication list given to you today.  *If you need a refill on your cardiac medications before your next appointment, please call your pharmacy*   Lab Work: none If you have labs (blood work) drawn today and your tests are completely normal, you will receive your results only by: Marland Kitchen MyChart Message (if you have MyChart) OR . A paper copy in the mail If you have any lab test that is abnormal or we need to change your treatment, we will call you to review the results.   Testing/Procedures:Your physician has requested that you have an echocardiogram. Echocardiography is a painless test that uses sound waves to create images of your heart. It provides your doctor with information about the size and shape of your heart and how well your heart's chambers and valves are working. This procedure takes approximately one hour. There are no restrictions for this procedure.     Follow-Up: At [facility], you and your health needs are our priority.  As part of our continuing mission to provide you with exceptional heart care, we have created designated Provider Care Teams.  These Care Teams include your primary Cardiologist (physician) and Advanced Practice Providers (APPs -  Physician Assistants and Nurse Practitioners) who all work together to provide you with the care you need, when you need it.  We recommend signing up for the patient portal called "MyChart".  Sign up information is provided on this After Visit Summary.  MyChart is used to connect with patients for Virtual Visits (Telemedicine).  Patients are able to view lab/test results, encounter notes, upcoming appointments, etc.  Non-urgent messages can be sent to your provider as well.   To learn more about what you can do with MyChart, go to [email or web address].

## 2020-10-22 NOTE — Progress Notes (Signed)
Cardiology Office Note   Date:  10/22/2020   ID:  Vicki Alexander, DOB May 14, 1961, MRN [id number]  PCP:  Elby Showers, MD  Cardiologist:   Dorris Carnes, MD       History of Present Illness: Vicki Alexander is a 61 y.o. female with a history of an abnormal EKG   She had an EKG back in 2012  REpeat done recently  Showed IRBBB  Also showed inferior Q waves       The pt had one episode of CP in 2010 Attrib the spell at time to reflux and possible esophageal spasm   Has not had CP since  She is active   Walks 3 to 5 miles per day  No CP  breathng is OK   No dizziness   Diet:  Doesn't eat much veggies  Does have sodas         Current Meds  Medication Sig  . esomeprazole (NEXIUM) 40 MG capsule Take 40 mg by mouth daily at 12 noon. REFILLED BY MARY DENISE ON 10/10/19 FOR A YEAR  . gabapentin (NEURONTIN) 300 MG capsule Take one tablet at night     Allergies:   Bee venom   Past Medical History:  Diagnosis Date  . Anemia    ferritin stores low  . Back pain   . GERD (gastroesophageal reflux disease)   . Migraine   . Mitral valve prolapse   . PONV (postoperative nausea and vomiting)   . Scoliosis   . Stress incontinence   . Urgency incontinence   . Urinary frequency   . Vaginal delivery 1993, 1997    Past Surgical History:  Procedure Laterality Date  . AUGMENTATION MAMMAPLASTY    . BREAST BIOPSY Bilateral    times three, implants 2 left 1 right   . CYSTO N/A 11/06/2015   Procedure: CYSTO;  Surgeon: Emily Filbert, MD;  Location: [facility];  Service: Gynecology;  Laterality: N/A;  . PUBOVAGINAL SLING N/A 11/06/2015   Procedure: MID [facility] Bussing;  Surgeon: Emily Filbert, MD;  Location: [facility];  Service: Gynecology;  Laterality: N/A;  . UPPER GASTROINTESTINAL ENDOSCOPY       Social History:  The patient  reports that she has never smoked. She has never used smokeless tobacco. She reports current alcohol use. She reports that she does not use drugs.   Family History:  The  patient's family history includes Cancer in her maternal aunt, maternal grandfather, and mother; Diabetes in her father, maternal grandfather, maternal grandmother, paternal grandfather, and paternal grandmother; Heart disease in her maternal grandfather, maternal grandmother, paternal grandfather, and paternal grandmother; Stroke in her father, maternal grandfather, maternal grandmother, paternal grandfather, and paternal grandmother.    ROS:  Please see the history of present illness. All other systems are reviewed and  Negative to the above problem except as noted.    PHYSICAL EXAM: VS:  BP 108/64   Pulse 69   Ht 5\' 4"  (1.626 m)   Wt 117 lb 3.2 oz (53.2 kg)   LMP 08/29/2015   BMI 20.12 kg/m   GEN: Well nourished, well developed, in no acute distress  HEENT: normal  Neck: no JVD, No carotid bruits, Cardiac: RRR; no murmurs, No LE edema  Respiratory:  clear to auscultation bilaterally, GI: soft, nontender, nondistended, + BS  No hepatomegaly  MS: no deformity Moving all extremities   Skin: warm and dry, no rash Neuro:  Strength and sensation are intact Psych: euthymic mood, full  affect   EKG:  EKG is ordered today.  SR   Incomp RBBB  LAD     Possible IWMI   Small R waves in III, AVF   Lipid Panel    Component Value Date/Time   CHOL 182 10/08/2018 0919   TRIG 53 10/08/2018 0919   HDL 75 10/08/2018 0919   CHOLHDL 2.4 10/08/2018 0919   VLDL 17 04/27/2017 0959   LDLCALC 93 10/08/2018 0919      Wt Readings from Last 3 Encounters:  10/22/20 117 lb 3.2 oz (53.2 kg)  07/02/20 120 lb (54.4 kg)  04/02/20 120 lb (54.4 kg)      ASSESSMENT AND PLAN:  1  Abnormal EKG  I am not convinced pt has had a myocardial infarct in 2010    EKG had similar changes in May 2012 [facility] is not having anginal symptoms   Active I would recomm an echo to evaluate LV funciton and wall motion    2 Diet   Discussed diet changes (limit sugar, increased veggies, TRE)    Can be followed by Dr Renold Genta      3  HCM  Lipids are good   LDL 93  HDL 75      Current medicines are reviewed at length with the patient today.  The patient does not have concerns regarding medicines.  Signed, Dorris Carnes, MD  10/22/2020 9:27 AM    [address], [place], [place]  [address] Phone: [phone]; Fax: [phone]

## 2020-11-02 MED FILL — GABAPENTIN 300 MG CAPSULE: 300 | 90 days supply | Qty: 90 | Fill #1

## 2020-11-06 DIAGNOSIS — Z803 Family history of malignant neoplasm of breast: Secondary | ICD-10-CM | POA: Diagnosis not present

## 2020-11-06 DIAGNOSIS — K7689 Other specified diseases of liver: Secondary | ICD-10-CM | POA: Diagnosis not present

## 2020-11-12 NOTE — Addendum Note (Signed)
Addended by: Janan Halter F on: 11/12/2020 06:57 AM   Modules accepted: Orders

## 2020-11-13 NOTE — Addendum Note (Signed)
Addended by: Patterson Hammersmith A on: 11/13/2020 10:30 AM   Modules accepted: Orders

## 2020-11-19 ENCOUNTER — Other Ambulatory Visit: Payer: Self-pay

## 2020-11-19 ENCOUNTER — Ambulatory Visit (HOSPITAL_COMMUNITY): Payer: 59 | Attending: Cardiology

## 2020-11-19 DIAGNOSIS — R9431 Abnormal electrocardiogram [ECG] [EKG]: Secondary | ICD-10-CM | POA: Diagnosis not present

## 2020-11-19 LAB — ECHOCARDIOGRAM COMPLETE
Area-P 1/2: 3.03 cm2
S' Lateral: 2.8 cm

## 2020-12-17 ENCOUNTER — Other Ambulatory Visit: Payer: Self-pay | Admitting: General Surgery

## 2020-12-17 DIAGNOSIS — K7689 Other specified diseases of liver: Secondary | ICD-10-CM

## 2021-01-04 ENCOUNTER — Ambulatory Visit
Admission: RE | Admit: 2021-01-04 | Discharge: 2021-01-04 | Disposition: A | Discharge status: Home / Self Care | Payer: 59 | Source: Ambulatory Visit | Attending: General Surgery | Admitting: General Surgery

## 2021-01-04 ENCOUNTER — Other Ambulatory Visit: Payer: Self-pay

## 2021-01-04 DIAGNOSIS — R932 Abnormal findings on diagnostic imaging of liver and biliary tract: Secondary | ICD-10-CM | POA: Diagnosis not present

## 2021-01-04 DIAGNOSIS — K7689 Other specified diseases of liver: Secondary | ICD-10-CM

## 2021-01-04 DIAGNOSIS — R935 Abnormal findings on diagnostic imaging of other abdominal regions, including retroperitoneum: Secondary | ICD-10-CM | POA: Diagnosis not present

## 2021-01-04 MED ORDER — GADOBENATE DIMEGLUMINE 529 MG/ML IV SOLN
10.0000 mL | Freq: Once | INTRAVENOUS | Status: AC | PRN
  Administered 2021-01-04: 10 mL via INTRAVENOUS

## 2021-02-20 ENCOUNTER — Other Ambulatory Visit: Payer: Self-pay | Admitting: General Surgery

## 2021-02-20 DIAGNOSIS — Z1231 Encounter for screening mammogram for malignant neoplasm of breast: Secondary | ICD-10-CM

## 2021-03-21 ENCOUNTER — Other Ambulatory Visit (HOSPITAL_COMMUNITY): Payer: Self-pay

## 2021-03-21 MED ORDER — CEPHALEXIN 500 MG PO CAPS
500.0000 mg | ORAL_CAPSULE | Freq: Three times a day (TID) | ORAL | 1 refills | Status: DC
  Filled 2021-03-21: qty 15, 5d supply, fill #0

## 2021-04-26 ENCOUNTER — Other Ambulatory Visit: Payer: Self-pay | Admitting: General Surgery

## 2021-04-26 ENCOUNTER — Ambulatory Visit
Admission: RE | Admit: 2021-04-26 | Discharge: 2021-04-26 | Disposition: A | Discharge status: Home / Self Care | Payer: 59 | Source: Ambulatory Visit | Attending: General Surgery | Admitting: General Surgery

## 2021-04-26 ENCOUNTER — Ambulatory Visit: Payer: 59

## 2021-04-26 ENCOUNTER — Other Ambulatory Visit: Payer: Self-pay

## 2021-04-26 DIAGNOSIS — Z1231 Encounter for screening mammogram for malignant neoplasm of breast: Secondary | ICD-10-CM

## 2021-06-21 ENCOUNTER — Ambulatory Visit: Payer: 59 | Admitting: Neurology

## 2021-06-21 ENCOUNTER — Other Ambulatory Visit (HOSPITAL_COMMUNITY): Payer: Self-pay

## 2021-06-21 ENCOUNTER — Encounter: Payer: Self-pay | Admitting: Neurology

## 2021-06-21 VITALS — BP 101/61 | HR 98 | Ht 64.0 in | Wt 112.4 lb

## 2021-06-21 DIAGNOSIS — G43709 Chronic migraine without aura, not intractable, without status migrainosus: Secondary | ICD-10-CM | POA: Diagnosis not present

## 2021-06-21 DIAGNOSIS — R635 Abnormal weight gain: Secondary | ICD-10-CM | POA: Diagnosis not present

## 2021-06-21 DIAGNOSIS — R5383 Other fatigue: Secondary | ICD-10-CM | POA: Diagnosis not present

## 2021-06-21 DIAGNOSIS — R519 Headache, unspecified: Secondary | ICD-10-CM

## 2021-06-21 DIAGNOSIS — G43109 Migraine with aura, not intractable, without status migrainosus: Secondary | ICD-10-CM

## 2021-06-21 DIAGNOSIS — L659 Nonscarring hair loss, unspecified: Secondary | ICD-10-CM

## 2021-06-21 DIAGNOSIS — R51 Headache with orthostatic component, not elsewhere classified: Secondary | ICD-10-CM

## 2021-06-21 DIAGNOSIS — Z79899 Other long term (current) drug therapy: Secondary | ICD-10-CM | POA: Diagnosis not present

## 2021-06-21 DIAGNOSIS — R200 Anesthesia of skin: Secondary | ICD-10-CM | POA: Diagnosis not present

## 2021-06-21 DIAGNOSIS — E785 Hyperlipidemia, unspecified: Secondary | ICD-10-CM

## 2021-06-21 DIAGNOSIS — H539 Unspecified visual disturbance: Secondary | ICD-10-CM | POA: Diagnosis not present

## 2021-06-21 MED ORDER — SUMATRIPTAN SUCCINATE 100 MG PO TABS
100.0000 mg | ORAL_TABLET | Freq: Once | ORAL | 12 refills | Status: AC | PRN
  Filled 2021-06-21 – 2021-07-05 (×2): qty 9, 30d supply, fill #0

## 2021-06-21 NOTE — Patient Instructions (Addendum)
Acute/emergent:   Imitrex: Please take one tablet at the onset of your headache. If it does not improve the symptoms please take one additional tablet. Do not take more then 2 tablets in 24hrs. Do not take use more then 2 to 3 times in a week.  Ondansetron: Take with triptan or alone for nausea/vomiting   Preventative:  Start botox: Preventative  MRI of the brain w/wo contrast     Fremanezumab injection What is this medication? FREMANEZUMAB (fre ma NEZ ue mab) is used to prevent migraine headaches. This medicine may be used for other purposes; ask your health care provider or pharmacist if you have questions. COMMON BRAND NAME(S): AJOVY What should I tell my care team before I take this medication? They need to know if you have any of these conditions: an unusual or allergic reaction to fremanezumab, other medicines, foods, dyes, or preservatives pregnant or trying to get pregnant breast-feeding How should I use this medication? This medicine is for injection under the skin. You will be taught how to prepare and give this medicine. Use exactly as directed. Take your medicine at regular intervals. Do not take your medicine more often than directed. It is important that you put your used needles and syringes in a special sharps container. Do not put them in a trash can. If you do not have a sharps container, call your pharmacist or healthcare provider to get one. Talk to your pediatrician regarding the use of this medicine in children. Special care may be needed. Overdosage: If you think you have taken too much of this medicine contact a poison control center or emergency room at once. NOTE: This medicine is only for you. Do not share this medicine with others. What if I miss a dose? If you miss a dose, take it as soon as you can. If it is almost time for your next dose, take only that dose. Do not take double or extra doses. What may interact with this medication? Interactions are not  expected. This list may not describe all possible interactions. Give your health care provider a list of all the medicines, herbs, non-prescription drugs, or dietary supplements you use. Also tell them if you smoke, drink alcohol, or use illegal drugs. Some items may interact with your medicine. What should I watch for while using this medication? Tell your doctor or healthcare professional if your symptoms do not start to get better or if they get worse. What side effects may I notice from receiving this medication? Side effects that you should report to your doctor or health care professional as soon as possible: allergic reactions like skin rash, itching or hives, swelling of the face, lips, or tongue Side effects that usually do not require medical attention (report these to your doctor or health care professional if they continue or are bothersome): pain, redness, or irritation at site where injected This list may not describe all possible side effects. Call your doctor for medical advice about side effects. You may report side effects to FDA at 1-800-FDA-1088. Where should I keep my medication? Keep out of the reach of children. You will be instructed on how to store this medicine. Throw away any unused medicine after the expiration date on the label. NOTE: This sheet is a summary. It may not cover all possible information. If you have questions about this medicine, talk to your doctor, pharmacist, or health care provider.  2022 Else[employer]/Gold Standard (2017-05-25 17:22:56) Sumatriptan Tablets What is this medication? SUMATRIPTAN (soo  ma TRIP tan) treats migraines. It works by blocking pain signals and narrowing blood vessels in the brain. It belongs to a group of medications called triptans. It is not used to prevent migraines. This medicine may be used for other purposes; ask your health care provider or pharmacist if you have questions. COMMON BRAND NAME(S): Imitrex, Migraine Pack What  should I tell my care team before I take this medication? They need to know if you have any of these conditions: Cigarette smoker Circulation problems in fingers and toes Heart disease High blood pressure High blood sugar (diabetes) High cholesterol History of irregular heartbeat History of stroke Kidney disease Liver disease Stomach or intestine problems An unusual or allergic reaction to sumatriptan, other medications, foods, dyes, or preservatives Pregnant or trying to get pregnant Breast-feeding How should I use this medication? Take this medication by mouth with a glass of water. Follow the directions on the prescription label. Do not take it more often than directed. Talk to your care team regarding the use of this medication in children. Special care may be needed. Overdosage: If you think you have taken too much of this medicine contact a poison control center or emergency room at once. NOTE: This medicine is only for you. Do not share this medicine with others. What if I miss a dose? This does not apply. This medication is not for regular use. What may interact with this medication? Do not take this medication with any of the following: Certain medications for migraine headache like almotriptan, eletriptan, frovatriptan, naratriptan, rizatriptan, sumatriptan, zolmitriptan Ergot alkaloids like dihydroergotamine, ergonovine, ergotamine, methylergonovine MAOIs like Carbex, Eldepryl, Marplan, Nardil, and Parnate This medication may also interact with the following: Certain medications for depression, anxiety, or psychotic disorders This list may not describe all possible interactions. Give your health care provider a list of all the medicines, herbs, non-prescription drugs, or dietary supplements you use. Also tell them if you smoke, drink alcohol, or use illegal drugs. Some items may interact with your medicine. What should I watch for while using this medication? Visit your  care team for regular checks on your progress. Tell your care team if your symptoms do not start to get better or if they get worse. You may get drowsy or dizzy. Do not drive, use machinery, or do anything that needs mental alertness until you know how this medication affects you. Do not stand up or sit up quickly, especially if you are an older patient. This reduces the risk of dizzy or fainting spells. Alcohol may interfere with the effect of this medication. Tell your care team right away if you have any change in your eyesight. If you take migraine medications for 10 or more days a month, your migraines may get worse. Keep a diary of headache days and medication use. Contact your care team if your migraine attacks occur more frequently. What side effects may I notice from receiving this medication? Side effects that you should report to your care team as soon as possible: Allergic reactions-skin rash, itching, hives, swelling of the face, lips, tongue, or throat Burning, pain, tingling, or color changes in the legs or feet Heart attack-pain or tightness in the chest, shoulders, arms, or jaw, nausea, shortness of breath, cold or clammy skin, feeling faint or lightheaded Heart rhythm changes-fast or irregular heartbeat, dizziness, feeling faint or lightheaded, chest pain, trouble breathing Increase in blood pressure Raynaud's-cool, numb, or painful fingers or toes that may change color from pale, to blue, to red  Seizures Serotonin syndrome-irritability, confusion, fast or irregular heartbeat, muscle stiffness, twitching muscles, sweating, high fever, seizure, chills, vomiting, diarrhea Stroke-sudden numbness or weakness of the face, arm, or leg, trouble speaking, confusion, trouble walking, loss of balance or coordination, dizziness, severe headache, change in vision Sudden or severe stomach pain, nausea, vomiting, fever, or bloody diarrhea Vision loss Side effects that usually do not require  medical attention (report to your care team if they continue or are bothersome): Dizziness General discomfort or fatigue This list may not describe all possible side effects. Call your doctor for medical advice about side effects. You may report side effects to FDA at 1-800-FDA-1088. Where should I keep my medication? Keep out of the reach of children and pets. Store at room temperature between 2 and 30 degrees C (36 and 86 degrees F). Throw away any unused medication after the expiration date. NOTE: This sheet is a summary. It may not cover all possible information. If you have questions about this medicine, talk to your doctor, pharmacist, or health care provider.  2022 [employer]/Gold Standard (2020-09-21 12:21:30) Ondansetron Dissolving Tablets What is this medication? ONDANSETRON (on DAN se tron) prevents nausea and vomiting from chemotherapy, radiation, or surgery. It works by blocking substances in the body that may cause nausea or vomiting. It belongs to a group of medications called antiemetics. This medicine may be used for other purposes; ask your health care provider or pharmacist if you have questions. COMMON BRAND NAME(S): Zofran ODT What should I tell my care team before I take this medication? They need to know if you have any of these conditions: Heart disease History of irregular heartbeat Liver disease Low levels of magnesium or potassium in the blood An unusual or allergic reaction to ondansetron, granisetron, other medications, foods, dyes, or preservatives Pregnant or trying to get pregnant Breast-feeding How should I use this medication? These tablets are made to dissolve in the mouth. Do not try to push the tablet through the foil backing. With dry hands, peel away the foil backing and gently remove the tablet. Place the tablet in the mouth and allow it to dissolve, then swallow. While you may take these tablets with water, it is not necessary to do so. Talk to your  care team regarding the use of this medication in children. Special care may be needed. Overdosage: If you think you have taken too much of this medicine contact a poison control center or emergency room at once. NOTE: This medicine is only for you. Do not share this medicine with others. What if I miss a dose? If you miss a dose, take it as soon as you can. If it is almost time for your next dose, take only that dose. Do not take double or extra doses. What may interact with this medication? Do not take this medication with any of the following: Apomorphine Certain medications for fungal infections like fluconazole, itraconazole, ketoconazole, posaconazole, voriconazole Cisapride Dronedarone Pimozide Thioridazine This medication may also interact with the following: Carbamazepine Certain medications for depression, anxiety, or psychotic disturbances Fentanyl Linezolid MAOIs like Carbex, Eldepryl, Marplan, Nardil, and Parnate Methylene blue (injected into a vein) Other medications that prolong the QT interval (cause an abnormal heart rhythm) like dofetilide, ziprasidone Phenytoin Rifampicin Tramadol This list may not describe all possible interactions. Give your health care provider a list of all the medicines, herbs, non-prescription drugs, or dietary supplements you use. Also tell them if you smoke, drink alcohol, or use illegal drugs. Some items may interact  with your medicine. What should I watch for while using this medication? Check with your care team as soon as you can if you have any sign of an allergic reaction. What side effects may I notice from receiving this medication? Side effects that you should report to your care team as soon as possible: Allergic reactions-skin rash, itching, hives, swelling of the face, lips, tongue, or throat Bowel blockage-stomach cramping, unable to have a bowel movement or pass gas, loss of appetite, vomiting Chest pain (angina)-pain, pressure,  or tightness in the chest, neck, back, or arms Heart rhythm changes-fast or irregular heartbeat, dizziness, feeling faint or lightheaded, chest pain, trouble breathing Irritability, confusion, fast or irregular heartbeat, muscle stiffness, twitching muscles, sweating, high fever, seizure, chills, vomiting, diarrhea, which may be signs of serotonin syndrome Side effects that usually do not require medical attention (report to your care team if they continue or are bothersome): Constipation Diarrhea General discomfort and fatigue Headache This list may not describe all possible side effects. Call your doctor for medical advice about side effects. You may report side effects to FDA at 1-800-FDA-1088. Where should I keep my medication? Keep out of the reach of children and pets. Store between 2 and 30 degrees C (36 and 86 degrees F). Throw away any unused medication after the expiration date. NOTE: This sheet is a summary. It may not cover all possible information. If you have questions about this medicine, talk to your doctor, pharmacist, or health care provider.  2022 Elsevier/Gold Standard (2020-09-14 14:39:19)  Ubrogepant tablets What is this medication? UBROGEPANT (ue BROE je pant) is used to treat migraine headaches with or without aura. An aura is a strange feeling or visual disturbance that warns you of an attack. It is not used to prevent migraines. This medicine may be used for other purposes; ask your health care provider or pharmacist if you have questions. COMMON BRAND NAME(S): Roselyn Meier What should I tell my care team before I take this medication? They need to know if you have any of these conditions: kidney disease liver disease an unusual or allergic reaction to ubrogepant, other medicines, foods, dyes, or preservatives pregnant or trying to get pregnant breast-feeding How should I use this medication? Take this medicine by mouth with a glass of water. Follow the directions on  the prescription label. You can take it with or without food. If it upsets your stomach, take it with food. Take your medicine at regular intervals. Do not take it more often than directed. Do not stop taking except on your doctor's advice. Talk to your pediatrician about the use of this medicine in children. Special care may be needed. Overdosage: If you think you have taken too much of this medicine contact a poison control center or emergency room at once. NOTE: This medicine is only for you. Do not share this medicine with others. What if I miss a dose? This does not apply. This medicine is not for regular use. What may interact with this medication? Do not take this medicine with any of the following medicines: ceritinib certain antibiotics like chloramphenicol, clarithromycin, telithromycin certain antivirals for HIV like atazanavir, cobicistat, darunavir, delavirdine, fosamprenavir, indinavir, ritonavir certain medicines for fungal infections like itraconazole, ketoconazole, posaconazole, voriconazole conivaptan grapefruit idelalisib mifepristone nefazodone ribociclib This medicine may also interact with the following medications: carvedilol certain medicines for seizures like phenobarbital, phenytoin ciprofloxacin cyclosporine eltrombopag fluconazole fluvoxamine quinidine rifampin [facility]. John's wort verapamil This list may not describe all possible interactions. Give your  health care provider a list of all the medicines, herbs, non-prescription drugs, or dietary supplements you use. Also tell them if you smoke, drink alcohol, or use illegal drugs. Some items may interact with your medicine. What should I watch for while using this medication? Visit your health care professional for regular checks on your progress. Tell your health care professional if your symptoms do not start to get better or if they get worse. Your mouth may get dry. Chewing sugarless gum or sucking hard  candy and drinking plenty of water may help. Contact your health care professional if the problem does not go away or is severe. What side effects may I notice from receiving this medication? Side effects that you should report to your doctor or health care professional as soon as possible: allergic reactions like skin rash, itching or hives; swelling of the face, lips, or tongue Side effects that usually do not require medical attention (report these to your doctor or health care professional if they continue or are bothersome): drowsiness dry mouth nausea tiredness This list may not describe all possible side effects. Call your doctor for medical advice about side effects. You may report side effects to FDA at 1-800-FDA-1088. Where should I keep my medication? Keep out of the reach of children. Store at room temperature between 15 and 30 degrees C (59 and 86 degrees F). Throw away any unused medicine after the expiration date. NOTE: This sheet is a summary. It may not cover all possible information. If you have questions about this medicine, talk to your doctor, pharmacist, or health care provider.  2022 Elsevier/Gold Standard (2018-11-11 08:50:55)

## 2021-06-21 NOTE — Progress Notes (Signed)
[facility]    Provider:  Dr Jaynee Eagles Requesting Provider: Renold Genta Cresenciano Lick, MD Primary Care Provider:  Elby Showers, MD  CC:  Migraines  HPI:  Vicki Alexander is a 60 y.o. female here as requested by Elby Showers, MD for migraines. PMHx migraines, scoliosis. Migraines have been worsening the last 6 months.   Her migraines are pulsating/pounding/throbbing, can be unilateral or start in the temples. Ibuprofen helps. Daily headaches, she also gets neckpain and tightness. She is having more than 16 migraine days a month that are moderate to severe. No aura. No medication overuse. She has seen other neurologists in the past, had nerve blocks and tried multiple preventatives and lifestyle changes, diet changes without much help.  Her migraines can be unilateral and pulsating pounding throbbing as above, she has photophobia and phonophobia, she has nausea, ongoing at this frequency and severity for over 6 months and longer as she has had migraines for years. Migraine can last 24-48 hours.   She reports some concerning symptoms with some vision changes, headaches can be positional and can be worse when bending over or supine, she also has some numbness and tingling in her lower extremities we discussed causes of neuropathy and we will do a complete exam on her.  In her younger years, she had migraines  as auras without headache in the past as a younger woman, but has not had an aura in many years, her aura was floaters, usually black or gray but often color but hasn't had auras in years, now she gets them migraine head pain alone.  Reviewed notes, labs and imaging from outside physicians, which showed:  Medications tried that can be used in migaine management: wellbutrin, decadron, relpax, lexapro, gabapentin, ibuprofen, ketorolac, zofran, phenergan, effexor, imipramine, Topamax, verapamil, nortriptyline, amitriptyline, propranolol, Cymbalta, Maxalt, sumatriptan hand, Zomig, Relpax.  She  had side effects to Topamax, amitriptyline, and propranolol and the other she used for more than 3 months and were not effective.  Review of Systems: Patient complains of symptoms per HPI as well as the following symptoms paresthesias. Pertinent negatives and positives per HPI. All others negative.   Social History   Socioeconomic History   Marital status: Married    Spouse name: Secondary school teacher   Number of children: 2   Years of education: Not on file   Highest education level: Doctorate  Occupational History   Not on file  Tobacco Use   Smoking status: Never   Smokeless tobacco: Never  Substance and Sexual Activity   Alcohol use: Yes    Alcohol/week: 0.0 standard drinks    Comment: occ   Drug use: No   Sexual activity: Yes    Birth control/protection: Post-menopausal  Other Topics Concern   Not on file  Social History Narrative   Live with husband   Right handed   Caffeine: 32-40 oz in day   Social Determinants of Health   Financial Resource Strain: Not on file  Food Insecurity: Not on file  Transportation Needs: Not on file  Physical Activity: Not on file  Stress: Not on file  Social Connections: Not on file  Intimate Partner Violence: Not on file    Family History  Problem Relation Age of Onset   Cancer Mother        breast and ovarian   Stroke Father    Diabetes Father    Migraines Sister    Stroke Maternal Grandmother    Heart disease Maternal Grandmother  Diabetes Maternal Grandmother    Cancer Maternal Grandfather        colon   Stroke Maternal Grandfather    Heart disease Maternal Grandfather    Diabetes Maternal Grandfather    Stroke Paternal Grandmother    Heart disease Paternal Grandmother    Diabetes Paternal Grandmother    Stroke Paternal Grandfather    Heart disease Paternal Grandfather    Diabetes Paternal Grandfather    Cancer Maternal Aunt        breast    Past Medical History:  Diagnosis Date   Anemia    ferritin stores low    Back pain    GERD (gastroesophageal reflux disease)    Migraine    Mitral valve prolapse    PONV (postoperative nausea and vomiting)    Scoliosis    Stress incontinence    Urgency incontinence    Urinary frequency    Vaginal delivery 1993, 1997    Patient Active Problem List   Diagnosis Date Noted   Family history of colon cancer 08/18/2019   History of adenomatous polyp of colon 08/18/2019   Hot flashes 05/06/2017   Insomnia 05/06/2017   Gastroesophageal reflux disease 01/30/2015   Migraine 01/30/2015    Past Surgical History:  Procedure Laterality Date   AUGMENTATION MAMMAPLASTY     BREAST BIOPSY Bilateral    times three, implants 2 left 1 right    CYSTO N/A 11/06/2015   Procedure: CYSTO;  Surgeon: Emily Filbert, MD;  Location: [facility];  Service: Gynecology;  Laterality: N/A;   PUBOVAGINAL SLING N/A 11/06/2015   Procedure: MID URETHERAL SLING;  Surgeon: Emily Filbert, MD;  Location: [facility];  Service: Gynecology;  Laterality: N/A;   UPPER GASTROINTESTINAL ENDOSCOPY      Current Outpatient Medications  Medication Sig Dispense Refill   esomeprazole (NEXIUM) 40 MG capsule Take 40 mg by mouth daily at 12 noon. REFILLED BY MARY DENISE ON 10/10/19 FOR A YEAR     gabapentin (NEURONTIN) 300 MG capsule TAKE 1 CAPSULE BY MOUTH AT NIGHT 90 capsule 3   Multiple Vitamin (MULTIVITAMIN WITH MINERALS) TABS tablet Take 1 tablet by mouth daily.     SUMAtriptan (IMITREX) 100 MG tablet Take 1 tablet (100 mg total) by mouth once as needed for up to 1 dose. May repeat in 2 hours if headache persists or recurs. 12 tablet 12   No current facility-administered medications for this visit.    Allergies as of 06/21/2021 - Review Complete 06/21/2021  Allergen Reaction Noted   Bee venom Anaphylaxis 01/30/2015    Vitals: BP 101/61   Pulse 98   Ht 5' 4"  (1.626 m)   Wt 112 lb 6.4 oz (51 kg)   LMP 08/29/2015   BMI 19.29 kg/m  Last Weight:  Wt Readings from Last 1 Encounters:  06/21/21 112 lb 6.4 oz  (51 kg)   Last Height:   Ht Readings from Last 1 Encounters:  06/21/21 5' 4"  (1.626 m)     Physical exam: Exam: Gen: NAD, conversant, well nourised, well groomed                     CV: RRR, no MRG. No Carotid Bruits. No peripheral edema, warm, nontender Eyes: Conjunctivae clear without exudates or hemorrhage  Neuro: Detailed Neurologic Exam  Speech:    Speech is normal; fluent and spontaneous with normal comprehension.  Cognition:    The patient is oriented to person, place, and time;  recent and remote memory intact;     language fluent;     normal attention, concentration,     fund of knowledge Cranial Nerves:    The pupils are equal, round, and reactive to light. The fundi are normal and spontaneous venous pulsations are present. Visual fields are full to finger confrontation. Extraocular movements are intact. Trigeminal sensation is intact and the muscles of mastication are normal. The face is symmetric. The palate elevates in the midline. Hearing intact. Voice is normal. Shoulder shrug is normal. The tongue has normal motion without fasciculations.   Coordination:    Normal finger to nose and heel to shin. Normal rapid alternating movements.   Gait:    Heel-toe and tandem gait are normal.   Motor Observation:    No asymmetry, no atrophy, and no involuntary movements noted. Tone:    Normal muscle tone.    Posture:    Posture is normal. normal erect    Strength:    Strength is V/V in the upper and lower limbs.      Sensation: intact to LT     Reflex Exam:  DTR's:    Deep tendon reflexes in the upper and lower extremities are normal bilaterally.   Toes:    The toes are downgoing bilaterally.   Clonus:    Clonus is absent.    Assessment/Plan:  Patient with chronic migraines failed multiple medications, we had a long discussion about her options today.   MRI of the brain: MRI brain due to concerning symptoms of morning headaches, positional  headaches,vision changes  to look for space occupying mass, chiari or intracranial hypertension (pseudotumor).  Acute/emergent:   Imitrex: Please take one tablet at the onset of your headache. If it does not improve the symptoms please take one additional tablet. Do not take more then 2 tablets in 24hrs. Do not take use more then 2 to 3 times in a week.  Ondansetron: Take with triptan or alone for nausea/vomiting   Preventative:  Start botox: Preventative  MRI of the brain w/wo contrast  Serum neuropathy workup   Orders Placed This Encounter  Procedures   MR BRAIN W WO CONTRAST   CBC with Differential/Platelets   Comprehensive metabolic panel   TSH   T4, Free   Hemoglobin A1c   B12 and Folate Panel   Methylmalonic acid, serum   Vitamin D, 25-hydroxy   Vitamin B1   Vitamin B6   Sedimentation rate   Sjogren's syndrome antibods(ssa + ssb)   Rheumatoid factor   Heavy metals, blood   Multiple Myeloma Panel (SPEP&IFE w/QIG)   Homocysteine   ANA Comprehensive Panel   Copper, serum   Zinc   Magnesium   Meds ordered this encounter  Medications   SUMAtriptan (IMITREX) 100 MG tablet    Sig: Take 1 tablet (100 mg total) by mouth once as needed for up to 1 dose. May repeat in 2 hours if headache persists or recurs.    Dispense:  12 tablet    Refill:  12    Dispense 12 or amount paid for by insurance     Cc: Baxley, Cresenciano Lick, MD,  Baxley, Cresenciano Lick, MD  Sarina Ill, MD  [facility] [address] [place], [place] [address]  Phone [phone] Fax [phone]  I spent over 60 minutes of face-to-face and non-face-to-face time with patient on the  1. Chronic migraine without aura without status migrainosus, not intractable   2. Migraine with aura and  without status migrainosus, not intractable   3. Other fatigue   4. Long-term use of high-risk medication   5. Hair loss   6. Weight gain   7. Numbness   8. Positional headache   9.  Vision changes   10. Morning headache    diagnosis.  This included previsit chart review, lab review, study review, order entry, electronic health record documentation, patient education on the different diagnostic and therapeutic options, counseling and coordination of care, risks and benefits of management, compliance, or risk factor reduction

## 2021-06-25 ENCOUNTER — Encounter: Payer: Self-pay | Admitting: Neurology

## 2021-06-25 DIAGNOSIS — Z1283 Encounter for screening for malignant neoplasm of skin: Secondary | ICD-10-CM | POA: Diagnosis not present

## 2021-06-25 DIAGNOSIS — X32XXXD Exposure to sunlight, subsequent encounter: Secondary | ICD-10-CM | POA: Diagnosis not present

## 2021-06-25 DIAGNOSIS — L570 Actinic keratosis: Secondary | ICD-10-CM | POA: Diagnosis not present

## 2021-06-25 DIAGNOSIS — D225 Melanocytic nevi of trunk: Secondary | ICD-10-CM | POA: Diagnosis not present

## 2021-06-25 DIAGNOSIS — L718 Other rosacea: Secondary | ICD-10-CM | POA: Diagnosis not present

## 2021-06-26 ENCOUNTER — Other Ambulatory Visit: Payer: Self-pay | Admitting: Neurology

## 2021-06-26 ENCOUNTER — Telehealth: Payer: Self-pay | Admitting: Neurology

## 2021-06-26 DIAGNOSIS — E785 Hyperlipidemia, unspecified: Secondary | ICD-10-CM

## 2021-06-26 LAB — HEMOGLOBIN A1C
Est. average glucose Bld gHb Est-mCnc: 108 mg/dL
Hgb A1c MFr Bld: 5.4 % (ref 4.8–5.6)

## 2021-06-26 LAB — ZINC: Zinc: 70 ug/dL (ref 44–115)

## 2021-06-26 LAB — ANA COMPREHENSIVE PANEL
Anti JO-1: <0.2 AI (ref 0.0–0.9)
Centromere Ab Screen: <0.2 AI (ref 0.0–0.9)
Chromatin Ab SerPl-aCnc: <0.2 AI (ref 0.0–0.9)
ENA RNP Ab: <0.2 AI (ref 0.0–0.9)
ENA SM Ab Ser-aCnc: <0.2 AI (ref 0.0–0.9)
ENA SSA (RO) Ab: <0.2 AI (ref 0.0–0.9)
ENA SSB (LA) Ab: <0.2 AI (ref 0.0–0.9)
Scleroderma (Scl-70) (ENA) Antibody, IgG: <0.2 AI (ref 0.0–0.9)
dsDNA Ab: <1 IU/mL (ref 0–9)

## 2021-06-26 LAB — COMPREHENSIVE METABOLIC PANEL
ALT: 16 IU/L (ref 0–32)
AST: 18 IU/L (ref 0–40)
Albumin/Globulin Ratio: 2 (ref 1.2–2.2)
Albumin: 4.5 g/dL (ref 3.8–4.9)
Alkaline Phosphatase: 93 IU/L (ref 44–121)
BUN/Creatinine Ratio: 17 (ref 9–23)
BUN: 13 mg/dL (ref 6–24)
Bilirubin Total: 0.4 mg/dL (ref 0.0–1.2)
CO2: 22 mmol/L (ref 20–29)
Calcium: 10 mg/dL (ref 8.7–10.2)
Chloride: 101 mmol/L (ref 96–106)
Creatinine, Ser: 0.76 mg/dL (ref 0.57–1.00)
Globulin, Total: 2.2 g/dL (ref 1.5–4.5)
Glucose: 92 mg/dL (ref 70–99)
Potassium: 4.3 mmol/L (ref 3.5–5.2)
Sodium: 139 mmol/L (ref 134–144)
Total Protein: 6.7 g/dL (ref 6.0–8.5)
eGFR: 90 mL/min/{1.73_m2} (ref >59)

## 2021-06-26 LAB — CBC WITH DIFFERENTIAL/PLATELET
Basophils Absolute: 0 10*3/uL (ref 0.0–0.2)
Basos: 0 %
EOS (ABSOLUTE): 0.4 10*3/uL (ref 0.0–0.4)
Eos: 9 %
Hematocrit: 41.5 % (ref 34.0–46.6)
Hemoglobin: 14.2 g/dL (ref 11.1–15.9)
Immature Grans (Abs): 0 10*3/uL (ref 0.0–0.1)
Immature Granulocytes: 0 %
Lymphocytes Absolute: 2.1 10*3/uL (ref 0.7–3.1)
Lymphs: 42 %
MCH: 30.9 pg (ref 26.6–33.0)
MCHC: 34.2 g/dL (ref 31.5–35.7)
MCV: 90 fL (ref 79–97)
Monocytes Absolute: 0.4 10*3/uL (ref 0.1–0.9)
Monocytes: 8 %
Neutrophils Absolute: 2.1 10*3/uL (ref 1.4–7.0)
Neutrophils: 41 %
Platelets: 258 10*3/uL (ref 150–450)
RBC: 4.59 x10E6/uL (ref 3.77–5.28)
RDW: 13.1 % (ref 11.7–15.4)
WBC: 5.1 10*3/uL (ref 3.4–10.8)

## 2021-06-26 LAB — METHYLMALONIC ACID, SERUM: Methylmalonic Acid: 219 nmol/L (ref 0–378)

## 2021-06-26 LAB — MULTIPLE MYELOMA PANEL, SERUM
Albumin SerPl Elph-Mcnc: 3.9 g/dL (ref 2.9–4.4)
Albumin/Glob SerPl: 1.4 (ref 0.7–1.7)
Alpha 1: 0.2 g/dL (ref 0.0–0.4)
Alpha2 Glob SerPl Elph-Mcnc: 0.7 g/dL (ref 0.4–1.0)
B-Globulin SerPl Elph-Mcnc: 1 g/dL (ref 0.7–1.3)
Gamma Glob SerPl Elph-Mcnc: 0.9 g/dL (ref 0.4–1.8)
Globulin, Total: 2.8 g/dL (ref 2.2–3.9)
IgA/Immunoglobulin A, Serum: 194 mg/dL (ref 87–352)
IgG (Immunoglobin G), Serum: 931 mg/dL (ref 586–1602)
IgM (Immunoglobulin M), Srm: 30 mg/dL (ref 26–217)

## 2021-06-26 LAB — B12 AND FOLATE PANEL
Folate: 11.1 ng/mL (ref >3.0)
Vitamin B-12: 361 pg/mL (ref 232–1245)

## 2021-06-26 LAB — SEDIMENTATION RATE: Sed Rate: 2 mm/hr (ref 0–40)

## 2021-06-26 LAB — MAGNESIUM: Magnesium: 2 mg/dL (ref 1.6–2.3)

## 2021-06-26 LAB — RHEUMATOID FACTOR: Rheumatoid fact SerPl-aCnc: <10 IU/mL (ref <14.0)

## 2021-06-26 LAB — TSH: TSH: 1.12 u[IU]/mL (ref 0.450–4.500)

## 2021-06-26 LAB — VITAMIN B1: Thiamine: 117.8 nmol/L (ref 66.5–200.0)

## 2021-06-26 LAB — VITAMIN D 25 HYDROXY (VIT D DEFICIENCY, FRACTURES): Vit D, 25-Hydroxy: 27.2 ng/mL — ABNORMAL LOW (ref 30.0–100.0)

## 2021-06-26 LAB — T4, FREE: Free T4: 1.27 ng/dL (ref 0.82–1.77)

## 2021-06-26 LAB — HEAVY METALS, BLOOD
Arsenic: <1 ug/L (ref 0–9)
Lead, Blood: <1 ug/dL (ref 0.0–3.4)
Mercury: <1 ug/L (ref 0.0–14.9)

## 2021-06-26 LAB — VITAMIN B6: Vitamin B6: 14.3 ug/L (ref 3.4–65.2)

## 2021-06-26 LAB — COPPER, SERUM: Copper: 119 ug/dL (ref 80–158)

## 2021-06-26 LAB — HOMOCYSTEINE: Homocysteine: 8.5 umol/L (ref 0.0–14.5)

## 2021-06-26 NOTE — Telephone Encounter (Signed)
Vicki Beam, MD  Donetta Potts [facility] Calls Please start approval process for botox for migraine dx B01.751

## 2021-06-26 NOTE — Telephone Encounter (Signed)
I called [facility] @ [phone] to obtain PA for Botox. Dx: W62.035. I spoke with Vaughan Basta who states records should be faxed over to [phone] for review. Pending reference #[id number]. Records faxed after call ended.

## 2021-06-27 DIAGNOSIS — E785 Hyperlipidemia, unspecified: Secondary | ICD-10-CM | POA: Diagnosis not present

## 2021-06-27 NOTE — Addendum Note (Signed)
Addended by: Artist Beach on: 06/27/2021 08:14 AM   Modules accepted: Orders

## 2021-06-28 LAB — LIPID PANEL
Chol/HDL Ratio: 2.7 ratio (ref 0.0–4.4)
Cholesterol, Total: 178 mg/dL (ref 100–199)
HDL: 66 mg/dL (ref >39)
LDL Chol Calc (NIH): 99 mg/dL (ref 0–99)
Triglycerides: 67 mg/dL (ref 0–149)
VLDL Cholesterol Cal: 13 mg/dL (ref 5–40)

## 2021-07-01 ENCOUNTER — Other Ambulatory Visit (HOSPITAL_COMMUNITY): Payer: Self-pay

## 2021-07-02 ENCOUNTER — Telehealth: Payer: Self-pay | Admitting: Neurology

## 2021-07-02 NOTE — Telephone Encounter (Signed)
[employer] Josem Kaufmann: [id number] (exp. 07/02/21 to 07/31/21) order sent to [facility], they will reach out to the patient to schedule.

## 2021-07-05 ENCOUNTER — Other Ambulatory Visit (HOSPITAL_COMMUNITY): Payer: Self-pay

## 2021-07-08 NOTE — Telephone Encounter (Signed)
Received approval from [facility]. PA #[id number] (06/26/21- 12/25/21).

## 2021-08-05 ENCOUNTER — Telehealth: Payer: Self-pay | Admitting: Internal Medicine

## 2021-08-05 ENCOUNTER — Encounter: Payer: Self-pay | Admitting: Internal Medicine

## 2021-08-05 ENCOUNTER — Other Ambulatory Visit: Payer: Self-pay

## 2021-08-05 ENCOUNTER — Ambulatory Visit (INDEPENDENT_AMBULATORY_CARE_PROVIDER_SITE_OTHER): Payer: 59 | Admitting: Internal Medicine

## 2021-08-05 VITALS — HR 104 | Temp 103.0°F

## 2021-08-05 DIAGNOSIS — R509 Fever, unspecified: Secondary | ICD-10-CM | POA: Diagnosis not present

## 2021-08-05 DIAGNOSIS — J22 Unspecified acute lower respiratory infection: Secondary | ICD-10-CM | POA: Diagnosis not present

## 2021-08-05 DIAGNOSIS — R059 Cough, unspecified: Secondary | ICD-10-CM | POA: Diagnosis not present

## 2021-08-05 DIAGNOSIS — J101 Influenza due to other identified influenza virus with other respiratory manifestations: Secondary | ICD-10-CM

## 2021-08-05 LAB — POCT INFLUENZA A/B
Influenza A, POC: POSITIVE — AB
Influenza B, POC: NEGATIVE

## 2021-08-05 MED ORDER — HYDROCODONE BIT-HOMATROP MBR 5-1.5 MG/5ML PO SOLN: 5.0000 mL | Freq: Three times a day (TID) | ORAL | 0 refills | Status: DC | PRN

## 2021-08-05 MED ORDER — OSELTAMIVIR PHOSPHATE 75 MG PO CAPS: 75.0000 mg | ORAL_CAPSULE | Freq: Two times a day (BID) | ORAL | 0 refills | Status: DC

## 2021-08-05 MED ORDER — AZITHROMYCIN 250 MG PO TABS: ORAL_TABLET | ORAL | 0 refills | Status: AC

## 2021-08-05 NOTE — Telephone Encounter (Signed)
Schedule

## 2021-08-05 NOTE — Patient Instructions (Signed)
You have tested positive for Influenza A. I have sent in Rx for Tamiflu 75 mg twice a day for 5 days. Take Zithromax Z pak as well 2 tabs day 1 followed by one tab days 2-5. May take Hycodan one tsp every 6-8 hours as needed for cough. Rest and stay well hydrated. Call if questions or concerns.

## 2021-08-05 NOTE — Telephone Encounter (Signed)
Vicki Alexander [phone]  [name] called to say she has headache, muscle aches, chest congestion, really bad cough, since Friday evening. She tested late Friday night and this morning for COVID and they were negative. She has been taking musinex and ibuprofen, but she still feels really bad. Has had 2 COVID Vaccines and 1 booster. Had Flu shot about 3 weeks ago.

## 2021-08-05 NOTE — Progress Notes (Signed)
   Subjective:    Patient ID: Vicki Alexander, female    DOB: [birth year], 60 y.o.   MRN: [id number]  HPI 60 year old Female in good health working an a Marine scientist for [employer]. Had onset of headache, myalgias, chest congestion and cough on Friday, November 25.  She tested late Friday evening and again today for COVID-19 and both test were negative.  She has been taking Mucinex and ibuprofen but has considerable malaise.  Had flu vaccine about 3 weeks ago and has had 2 COVID vaccines and 1 booster.  She is seen today limping fatigued with respiratory congestion.      Review of Systems see above     Objective:   Physical Exam Temperature is 103 degrees pulse 104 pulse oximetry 97% She looks pale.  Pharynx slightly injected.  TMs clear.  Chest exam: Coarse breath sounds in lower lobes.  COVID-19 PCR test obtained.  Rapid influenza test is positive for influenza A    Assessment & Plan:   Influenza A  Plan: Tamiflu 75 mg twice daily for 5 days.  Hycodan 1 teaspoon every 8 hours with food for cough.  Zithromax Z-PAK 2 tabs day 1 followed by 1 tab days 2 through 5 for acute lower respiratory infection.  Rest and drink plenty of fluids.  Quarantine for several days.

## 2021-08-08 LAB — TIQ-NTM

## 2021-08-08 LAB — TIQ-MISC

## 2021-08-09 DIAGNOSIS — H04123 Dry eye syndrome of bilateral lacrimal glands: Secondary | ICD-10-CM | POA: Diagnosis not present

## 2021-08-12 ENCOUNTER — Other Ambulatory Visit: Payer: Self-pay | Admitting: General Surgery

## 2021-08-12 DIAGNOSIS — Z1239 Encounter for other screening for malignant neoplasm of breast: Secondary | ICD-10-CM

## 2021-08-15 LAB — COVID, FLU A, FLU B
FLU A: DETECTED — AB
FLU B: NOT DETECTED
SARS CoV2 RNA: NOT DETECTED

## 2021-08-15 LAB — SARS-COV-2 RNA (COVID-19) RESP VIRAL PNL QL NAAT

## 2021-08-30 ENCOUNTER — Other Ambulatory Visit: Payer: 59

## 2021-09-06 ENCOUNTER — Other Ambulatory Visit: Payer: Self-pay

## 2021-09-06 ENCOUNTER — Ambulatory Visit
Admission: RE | Admit: 2021-09-06 | Discharge: 2021-09-06 | Disposition: A | Discharge status: Home / Self Care | Payer: 59 | Source: Ambulatory Visit | Attending: General Surgery | Admitting: General Surgery

## 2021-09-06 DIAGNOSIS — Z1239 Encounter for other screening for malignant neoplasm of breast: Secondary | ICD-10-CM

## 2021-09-06 DIAGNOSIS — R928 Other abnormal and inconclusive findings on diagnostic imaging of breast: Secondary | ICD-10-CM | POA: Diagnosis not present

## 2021-09-06 DIAGNOSIS — Z803 Family history of malignant neoplasm of breast: Secondary | ICD-10-CM | POA: Diagnosis not present

## 2021-09-06 MED ORDER — GADOBUTROL 1 MMOL/ML IV SOLN
5.0000 mL | Freq: Once | INTRAVENOUS | Status: AC | PRN
  Administered 2021-09-06: 08:00:00 5 mL via INTRAVENOUS

## 2021-09-08 ENCOUNTER — Other Ambulatory Visit: Payer: Self-pay | Admitting: Neurology

## 2022-03-05 ENCOUNTER — Other Ambulatory Visit: Payer: Self-pay | Admitting: Neurology

## 2022-03-05 ENCOUNTER — Other Ambulatory Visit: Payer: 59

## 2022-03-05 ENCOUNTER — Telehealth: Payer: Self-pay | Admitting: Internal Medicine

## 2022-03-05 DIAGNOSIS — R21 Rash and other nonspecific skin eruption: Secondary | ICD-10-CM

## 2022-03-05 MED ORDER — METHYLPREDNISOLONE 4 MG PO TBPK: ORAL_TABLET | ORAL | 1 refills | Status: DC

## 2022-03-05 NOTE — Telephone Encounter (Signed)
Cline Crock [phone]  [name] called to say she has a rash all of for about a week and last night it keep her up. She is leaving to go out of town tomorrow. I put her on the schedule at 9:30, she has tried several things and it has not helped, she feels like she needs a steroid.

## 2022-03-06 ENCOUNTER — Telehealth (INDEPENDENT_AMBULATORY_CARE_PROVIDER_SITE_OTHER): Payer: 59 | Admitting: Internal Medicine

## 2022-03-06 ENCOUNTER — Encounter: Payer: Self-pay | Admitting: Internal Medicine

## 2022-03-06 ENCOUNTER — Other Ambulatory Visit (HOSPITAL_COMMUNITY): Payer: Self-pay

## 2022-03-06 DIAGNOSIS — R21 Rash and other nonspecific skin eruption: Secondary | ICD-10-CM | POA: Diagnosis not present

## 2022-03-06 DIAGNOSIS — L282 Other prurigo: Secondary | ICD-10-CM

## 2022-03-06 LAB — COMPREHENSIVE METABOLIC PANEL
ALT: 16 IU/L (ref 0–32)
AST: 20 IU/L (ref 0–40)
Albumin/Globulin Ratio: 1.9 (ref 1.2–2.2)
Albumin: 4.6 g/dL (ref 3.8–4.9)
Alkaline Phosphatase: 100 IU/L (ref 44–121)
BUN/Creatinine Ratio: 14 (ref 9–23)
BUN: 13 mg/dL (ref 6–24)
Bilirubin Total: 0.3 mg/dL (ref 0.0–1.2)
CO2: 26 mmol/L (ref 20–29)
Calcium: 9.6 mg/dL (ref 8.7–10.2)
Chloride: 99 mmol/L (ref 96–106)
Creatinine, Ser: 0.94 mg/dL (ref 0.57–1.00)
Globulin, Total: 2.4 g/dL (ref 1.5–4.5)
Glucose: 81 mg/dL (ref 70–99)
Potassium: 4.2 mmol/L (ref 3.5–5.2)
Sodium: 140 mmol/L (ref 134–144)
Total Protein: 7 g/dL (ref 6.0–8.5)
eGFR: 70 mL/min/{1.73_m2} (ref >59)

## 2022-03-06 LAB — CBC WITH DIFFERENTIAL/PLATELET
Basophils Absolute: 0 10*3/uL (ref 0.0–0.2)
Basos: 1 %
EOS (ABSOLUTE): 0.4 10*3/uL (ref 0.0–0.4)
Eos: 8 %
Hematocrit: 41.6 % (ref 34.0–46.6)
Hemoglobin: 14.1 g/dL (ref 11.1–15.9)
Immature Grans (Abs): 0 10*3/uL (ref 0.0–0.1)
Immature Granulocytes: 0 %
Lymphocytes Absolute: 2.1 10*3/uL (ref 0.7–3.1)
Lymphs: 37 %
MCH: 30 pg (ref 26.6–33.0)
MCHC: 33.9 g/dL (ref 31.5–35.7)
MCV: 89 fL (ref 79–97)
Monocytes Absolute: 0.5 10*3/uL (ref 0.1–0.9)
Monocytes: 9 %
Neutrophils Absolute: 2.6 10*3/uL (ref 1.4–7.0)
Neutrophils: 45 %
Platelets: 288 10*3/uL (ref 150–450)
RBC: 4.7 x10E6/uL (ref 3.77–5.28)
RDW: 12.8 % (ref 11.7–15.4)
WBC: 5.7 10*3/uL (ref 3.4–10.8)

## 2022-03-06 LAB — TSH: TSH: 1.31 u[IU]/mL (ref 0.450–4.500)

## 2022-03-06 LAB — T4, FREE: Free T4: 1.34 ng/dL (ref 0.82–1.77)

## 2022-03-06 MED ORDER — METHYLPREDNISOLONE 4 MG PO TABS
ORAL_TABLET | ORAL | 0 refills | Status: DC
  Filled 2022-03-06 – 2022-03-13 (×2): qty 42, 12d supply, fill #0

## 2022-03-06 MED ORDER — HYDROXYZINE PAMOATE 25 MG PO CAPS
25.0000 mg | ORAL_CAPSULE | Freq: Three times a day (TID) | ORAL | 1 refills | Status: DC | PRN
  Filled 2022-03-06: qty 30, 5d supply, fill #0

## 2022-03-06 NOTE — Progress Notes (Signed)
   Subjective:    Patient ID: Vicki Los, PA, female    DOB: 02/23/1961, 61 y.o.   MRN: [id number]  HPI Vicki Alexander has had an itchy diffuse rash for at least a week. Did hike some with husband recently and has wondered about tick exposure since this rash developed. No fever, chills, myalgias or headache. Has tried Benadryl and hydrocortisone cream without relief.  She is seen today via interactive audio and video telecommunications.  She is identified using 2 identifiers as Vicki Alexander. Vicki Alexander,  a patient in this practice.  She is in her car and I am at my office.  She is agreeable to visit in this format today.  She will be traveling out of town for the next few days.  Would like to have some relief from the itching and rash.  She was able to get a prescription for a Medrol dose pack yesterday and started taking that.  Her general health is excellent.  Some labs were drawn yesterday and her CBC is normal including white blood cell count of 5700.  C-Met was normal.  TSH was normal and free T4 was normal.         Review of Systems see above-history of migraine headaches and she reflux     Objective:   Physical Exam  She has diffuse erythema of both legs.  No evidence of insect bites noted on video.      Assessment & Plan:  Generalized pruritic rash-etiology unclear  Plan: Patient will take Medrol 4 mg in tapering course as directed.  I think it would be best to take a slow taper over 12 days i.e. 6-6-5-5-4-4-3-3-2-2-1-1 taper.  We are also sending in some hydroxyzine 25 mg to take 1 or 2 tablets every 8 hours as needed for itching for itching.  She has my phone number and will call if she has any concerns whatsoever.  Time spent with this visit is 15 minutes

## 2022-03-06 NOTE — Patient Instructions (Addendum)
Patient has been prescribed hydroxyzine 25 mg to take 1 or 2 tabs up to 3 times daily as needed for itching.  Also have prescribed Medrol 4 mg tablets to take in tapering course as directed for 12 days i.e. 6-6-5-5-4-4-3-3-2-2-1-1 taper.  She will call if not responding to this treatment or has additional questions.  It was a pleasure to see you today.

## 2022-03-13 ENCOUNTER — Other Ambulatory Visit (HOSPITAL_COMMUNITY): Payer: Self-pay

## 2022-03-30 NOTE — Progress Notes (Signed)
Cardiology Office Note   Date:  03/31/2022   ID:  Vicki Los, PA, DOB 06/09/1961, MRN [id number]  PCP:  Elby Showers, MD  Cardiologist:   Dorris Carnes, MD   Patient presents for cardiac clearance    History of Present Illness: Vicki Los, PA is a 61 y.o. female with a history of incomplete RBBB and inf Q waves on EKG    I saw her one year ago to review/evaluate   Echo was done that showed LVEF normal     Aorta measured at 37 mm  Since seen the pt has been doing good   No Cp  No palpitations   No SOB   Very active    Walks a lot    Denies dizziness   Says BP is always low  About to have breast surgery in [place]    Needs cardiac clearance       Current Meds  Medication Sig   esomeprazole (NEXIUM) 40 MG capsule Take 40 mg by mouth as needed. REFILLED BY MARY DENISE ON 10/10/19 FOR A YEAR   hydrOXYzine (VISTARIL) 25 MG capsule Take 1-2 capsules (25-50 mg total) by mouth every 8 (eight) hours as needed.   methylPREDNISolone (MEDROL) 4 MG tablet Take pills daily all together with food. Take the first dose (6 pills) as soon as possible. Take the rest each morning in the following taper order 6-6-5-5-4-4-3-3-2-2-1-1.   Multiple Vitamin (MULTIVITAMIN WITH MINERALS) TABS tablet Take 1 tablet by mouth daily.   SUMAtriptan (IMITREX) 100 MG tablet Take 1 tablet (100 mg total) by mouth once as needed for up to 1 dose. May repeat in 2 hours if headache persists or recurs.   VITAMIN D PO Take by mouth.     Allergies:   Bee venom   Past Medical History:  Diagnosis Date   Anemia    ferritin stores low   Back pain    GERD (gastroesophageal reflux disease)    Migraine    Mitral valve prolapse    PONV (postoperative nausea and vomiting)    Scoliosis    Stress incontinence    Urgency incontinence    Urinary frequency    Vaginal delivery 1993, 1997    Past Surgical History:  Procedure Laterality Date   AUGMENTATION MAMMAPLASTY     BREAST BIOPSY Bilateral     times three, implants 2 left 1 right    CYSTO N/A 11/06/2015   Procedure: CYSTO;  Surgeon: Emily Filbert, MD;  Location: [facility];  Service: Gynecology;  Laterality: N/A;   PUBOVAGINAL SLING N/A 11/06/2015   Procedure: MID URETHERAL SLING;  Surgeon: Emily Filbert, MD;  Location: [facility];  Service: Gynecology;  Laterality: N/A;   UPPER GASTROINTESTINAL ENDOSCOPY       Social History:  The patient  reports that she has never smoked. She has never used smokeless tobacco. She reports current alcohol use. She reports that she does not use drugs.   Family History:  The patient's family history includes Cancer in her maternal aunt, maternal grandfather, and mother; Diabetes in her father, maternal grandfather, maternal grandmother, paternal grandfather, and paternal grandmother; Heart disease in her maternal grandfather, maternal grandmother, paternal grandfather, and paternal grandmother; Migraines in her sister; Stroke in her father, maternal grandfather, maternal grandmother, paternal grandfather, and paternal grandmother.    ROS:  Please see the history of present illness. All other systems are reviewed and  Negative to the above problem  except as noted.    PHYSICAL EXAM: VS:  BP (!) 86/60   Pulse 83   Ht 5' 4.5" (1.638 m)   Wt 118 lb (53.5 kg)   LMP 08/29/2015   SpO2 96%   BMI 19.94 kg/m   GEN: Well nourished, well developed, in no acute distress  HEENT: normal  Neck: no JVD, carotid bruit Cardiac: RRR; no murmu  No LE edema  Respiratory:  clear to auscultation bilaterally, GI: soft, nontender, nondistended, + BS  No hepatomegaly  MS: no deformity Moving all extremities   Skin: warm and dry, no rash Neuro:  Strength and sensation are intact Psych: euthymic mood, full affect   EKG:  EKG is ordered today.  NSR   73 bpm  Incomp RBBB   \\  Echo   2022    1. Left ventricular ejection fraction, by estimation, is 60 to 65%. The  left ventricle has normal function. The left ventricle has no  regional  wall motion abnormalities. Left ventricular diastolic parameters are  consistent with Grade I diastolic  dysfunction (impaired relaxation). The average left ventricular global  longitudinal strain is -23.3 %. The global longitudinal strain is normal.   2. Right ventricular systolic function is normal. The right ventricular  size is normal.   3. The mitral valve is grossly normal. Trivial mitral valve  regurgitation.   4. The aortic valve is tricuspid. Aortic valve regurgitation is not  visualized. No aortic stenosis is present.   5. Aortic dilatation noted. There is mild dilatation of the aortic root,  measuring 37 mm.   6. The inferior vena cava is normal in size with greater than 50%  respiratory variability, suggesting right atrial pressure of 3 mmHg.   Lipid Panel    Component Value Date/Time   CHOL 178 06/27/2021 0814   TRIG 67 06/27/2021 0814   HDL 66 06/27/2021 0814   CHOLHDL 2.7 06/27/2021 0814   CHOLHDL 2.4 10/08/2018 0919   VLDL 17 04/27/2017 0959   LDLCALC 99 06/27/2021 0814   LDLCALC 93 10/08/2018 0919      Wt Readings from Last 3 Encounters:  03/31/22 118 lb (53.5 kg)  06/21/21 112 lb 6.4 oz (51 kg)  10/22/20 117 lb 3.2 oz (53.2 kg)      ASSESSMENT AND PLAN:  1  Preop risk assessment    Pt is very active   Asymptomatic    From a cardiac standpoint she is at low risk for surgery   OK to proceed without further terstin     2  EKG    No sigificant abnormalities  3  LIpids   Good   Last LDL 99  4   Aortic root>  I have reviewed images    Root 3.7   Normal for patients BSA  Follow up as needed      Current medicines are reviewed at length with the patient today.  The patient does not have concerns regarding medicines.  Signed, Dorris Carnes, MD  03/31/2022 11:54 AM    [address], [place], [place]  [address] Phone: [phone]; Fax: [phone]

## 2022-03-31 ENCOUNTER — Encounter: Payer: Self-pay | Admitting: Internal Medicine

## 2022-03-31 ENCOUNTER — Ambulatory Visit (INDEPENDENT_AMBULATORY_CARE_PROVIDER_SITE_OTHER): Payer: 59 | Admitting: Internal Medicine

## 2022-03-31 VITALS — BP 86/60 | HR 83 | Ht 64.5 in | Wt 118.0 lb

## 2022-03-31 DIAGNOSIS — R9431 Abnormal electrocardiogram [ECG] [EKG]: Secondary | ICD-10-CM | POA: Diagnosis not present

## 2022-03-31 NOTE — Patient Instructions (Signed)
Medication Instructions:   *If you need a refill on your cardiac medications before your next appointment, please call your pharmacy*   Lab Work:  If you have labs (blood work) drawn today and your tests are completely normal, you will receive your results only by: [place] (if you have MyChart) OR A paper copy in the mail If you have any lab test that is abnormal or we need to change your treatment, we will call you to review the results.   Testing/Procedures:    Follow-Up: At [facility], you and your health needs are our priority.  As part of our continuing mission to provide you with exceptional heart care, we have created designated Provider Care Teams.  These Care Teams include your primary Cardiologist (physician) and Advanced Practice Providers (APPs -  Physician Assistants and Nurse Practitioners) who all work together to provide you with the care you need, when you need it.  We recommend signing up for the patient portal called "MyChart".  Sign up information is provided on this After Visit Summary.  MyChart is used to connect with patients for Virtual Visits (Telemedicine).  Patients are able to view lab/test results, encounter notes, upcoming appointments, etc.  Non-urgent messages can be sent to your provider as well.   To learn more about what you can do with MyChart, go to [email or web address].    Your next appointment:  As Needed   Important Information About Sugar

## 2022-03-31 NOTE — Progress Notes (Unsigned)
Cardiology Office Note   Date:  03/31/2022   ID:  Vicki Los, PA, DOB [birth year], MRN [id number]  PCP:  Elby Showers, MD  Cardiologist:   Dorris Carnes, MD       History of Present Illness: Vicki Los, PA is a 61 y.o. female with a history of       No outpatient medications have been marked as taking for the 03/31/22 encounter (Letter (Out)) with Fay Records, MD.     Allergies:   Bee venom   Past Medical History:  Diagnosis Date   Anemia    ferritin stores low   Back pain    GERD (gastroesophageal reflux disease)    Migraine    Mitral valve prolapse    PONV (postoperative nausea and vomiting)    Scoliosis    Stress incontinence    Urgency incontinence    Urinary frequency    Vaginal delivery 1993, 1997    Past Surgical History:  Procedure Laterality Date   AUGMENTATION MAMMAPLASTY     BREAST BIOPSY Bilateral    times three, implants 2 left 1 right    CYSTO N/A 11/06/2015   Procedure: CYSTO;  Surgeon: Emily Filbert, MD;  Location: [facility];  Service: Gynecology;  Laterality: N/A;   PUBOVAGINAL SLING N/A 11/06/2015   Procedure: MID URETHERAL SLING;  Surgeon: Emily Filbert, MD;  Location: [facility];  Service: Gynecology;  Laterality: N/A;   UPPER GASTROINTESTINAL ENDOSCOPY       Social History:  The patient  reports that she has never smoked. She has never used smokeless tobacco. She reports current alcohol use. She reports that she does not use drugs.   Family History:  The patient's family history includes Cancer in her maternal aunt, maternal grandfather, and mother; Diabetes in her father, maternal grandfather, maternal grandmother, paternal grandfather, and paternal grandmother; Heart disease in her maternal grandfather, maternal grandmother, paternal grandfather, and paternal grandmother; Migraines in her sister; Stroke in her father, maternal grandfather, maternal grandmother, paternal grandfather, and paternal grandmother.    ROS:  Please see the  history of present illness. All other systems are reviewed and  Negative to the above problem except as noted.    PHYSICAL EXAM: VS:  LMP 08/29/2015   GEN: Well nourished, well developed, in no acute distress  HEENT: normal  Neck: no JVD, carotid bruits, or masses Cardiac: RRR; no murmurs, rubs, or gallops,no edema  Respiratory:  clear to auscultation bilaterally, normal work of breathing GI: soft, nontender, nondistended, + BS  No hepatomegaly  MS: no deformity Moving all extremities   Skin: warm and dry, no rash Neuro:  Strength and sensation are intact Psych: euthymic mood, full affect   EKG:  EKG is ordered today.   Lipid Panel    Component Value Date/Time   CHOL 178 06/27/2021 0814   TRIG 67 06/27/2021 0814   HDL 66 06/27/2021 0814   CHOLHDL 2.7 06/27/2021 0814   CHOLHDL 2.4 10/08/2018 0919   VLDL 17 04/27/2017 0959   LDLCALC 99 06/27/2021 0814   LDLCALC 93 10/08/2018 0919      Wt Readings from Last 3 Encounters:  03/31/22 118 lb (53.5 kg)  06/21/21 112 lb 6.4 oz (51 kg)  10/22/20 117 lb 3.2 oz (53.2 kg)      ASSESSMENT AND PLAN:     Current medicines are reviewed at length with the patient today.  The patient does not have concerns regarding medicines.  Signed, Nevin Bloodgood  Harrington Challenger, MD  03/31/2022 10:24 PM    [facility] [address], [place], [place]  [address] Phone: [phone]; Fax: [phone]     Cardiology Office Note   Date:  03/31/2022   ID:  Vicki Los, PA, DOB Apr 22, 1961, MRN [id number]  PCP:  Elby Showers, MD  Cardiologist:   Dorris Carnes, MD       History of Present Illness: Vicki Los, PA is a 61 y.o. female with a history of       No outpatient medications have been marked as taking for the 03/31/22 encounter (Letter (Out)) with Fay Records, MD.     Allergies:   Bee venom   Past Medical History:  Diagnosis Date   Anemia    ferritin stores low   Back pain    GERD (gastroesophageal reflux  disease)    Migraine    Mitral valve prolapse    PONV (postoperative nausea and vomiting)    Scoliosis    Stress incontinence    Urgency incontinence    Urinary frequency    Vaginal delivery 1993, 1997    Past Surgical History:  Procedure Laterality Date   AUGMENTATION MAMMAPLASTY     BREAST BIOPSY Bilateral    times three, implants 2 left 1 right    CYSTO N/A 11/06/2015   Procedure: CYSTO;  Surgeon: Emily Filbert, MD;  Location: [facility];  Service: Gynecology;  Laterality: N/A;   PUBOVAGINAL SLING N/A 11/06/2015   Procedure: MID URETHERAL SLING;  Surgeon: Emily Filbert, MD;  Location: [facility];  Service: Gynecology;  Laterality: N/A;   UPPER GASTROINTESTINAL ENDOSCOPY       Social History:  The patient  reports that she has never smoked. She has never used smokeless tobacco. She reports current alcohol use. She reports that she does not use drugs.   Family History:  The patient's family history includes Cancer in her maternal aunt, maternal grandfather, and mother; Diabetes in her father, maternal grandfather, maternal grandmother, paternal grandfather, and paternal grandmother; Heart disease in her maternal grandfather, maternal grandmother, paternal grandfather, and paternal grandmother; Migraines in her sister; Stroke in her father, maternal grandfather, maternal grandmother, paternal grandfather, and paternal grandmother.    ROS:  Please see the history of present illness. All other systems are reviewed and  Negative to the above problem except as noted.    PHYSICAL EXAM: VS:  LMP 08/29/2015   GEN: Well nourished, well developed, in no acute distress  HEENT: normal  Neck: no JVD, carotid bruits, or masses Cardiac: RRR; no murmurs, rubs, or gallops,no edema  Respiratory:  clear to auscultation bilaterally, normal work of breathing GI: soft, nontender, nondistended, + BS  No hepatomegaly  MS: no deformity Moving all extremities   Skin: warm and dry, no rash Neuro:  Strength and  sensation are intact Psych: euthymic mood, full affect   EKG:  EKG is ordered today.   Lipid Panel    Component Value Date/Time   CHOL 178 06/27/2021 0814   TRIG 67 06/27/2021 0814   HDL 66 06/27/2021 0814   CHOLHDL 2.7 06/27/2021 0814   CHOLHDL 2.4 10/08/2018 0919   VLDL 17 04/27/2017 0959   LDLCALC 99 06/27/2021 0814   LDLCALC 93 10/08/2018 0919      Wt Readings from Last 3 Encounters:  03/31/22 118 lb (53.5 kg)  06/21/21 112 lb 6.4 oz (51 kg)  10/22/20 117 lb 3.2 oz (53.2 kg)  ASSESSMENT AND PLAN:     Current medicines are reviewed at length with the patient today.  The patient does not have concerns regarding medicines.  Signed, Dorris Carnes, MD  03/31/2022 10:24 PM    [facility], [place], [place]  [address] Phone: [phone]; Fax: [phone]

## 2022-03-31 NOTE — Progress Notes (Unsigned)
Error

## 2022-04-09 DIAGNOSIS — L981 Factitial dermatitis: Secondary | ICD-10-CM | POA: Diagnosis not present

## 2022-04-09 DIAGNOSIS — L820 Inflamed seborrheic keratosis: Secondary | ICD-10-CM | POA: Diagnosis not present

## 2022-04-09 DIAGNOSIS — L821 Other seborrheic keratosis: Secondary | ICD-10-CM | POA: Diagnosis not present

## 2022-04-09 DIAGNOSIS — Z1283 Encounter for screening for malignant neoplasm of skin: Secondary | ICD-10-CM | POA: Diagnosis not present

## 2022-05-30 IMAGING — MG DIGITAL SCREENING BREAST BILAT IMPLANT W/ TOMO W/ CAD
8 of 12 series · 8 of 28 positions shown · non-contrast
Comparison: Previous exam(s).

CLINICAL DATA: Screening.

EXAM:
DIGITAL SCREENING BILATERAL MAMMOGRAM WITH IMPLANTS, CAD AND
TOMOSYNTHESIS
TECHNIQUE: Bilateral screening digital craniocaudal and mediolateral oblique
mammograms were obtained. Bilateral screening digital breast
tomosynthesis was performed. The images were evaluated with
computer-aided detection. Standard and/or implant displaced views
were performed.

[R CC]
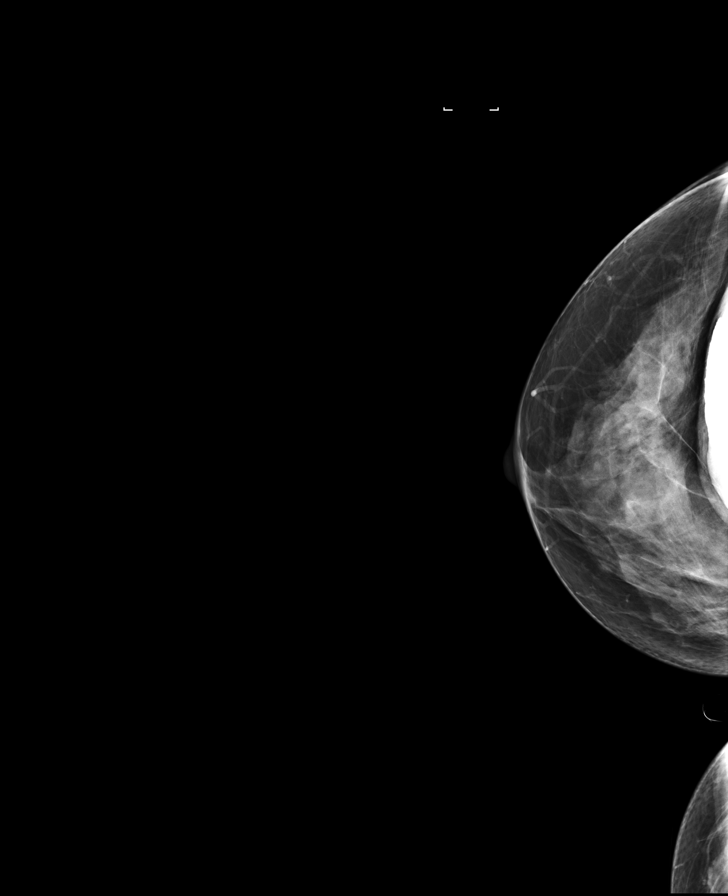

[L MLO]
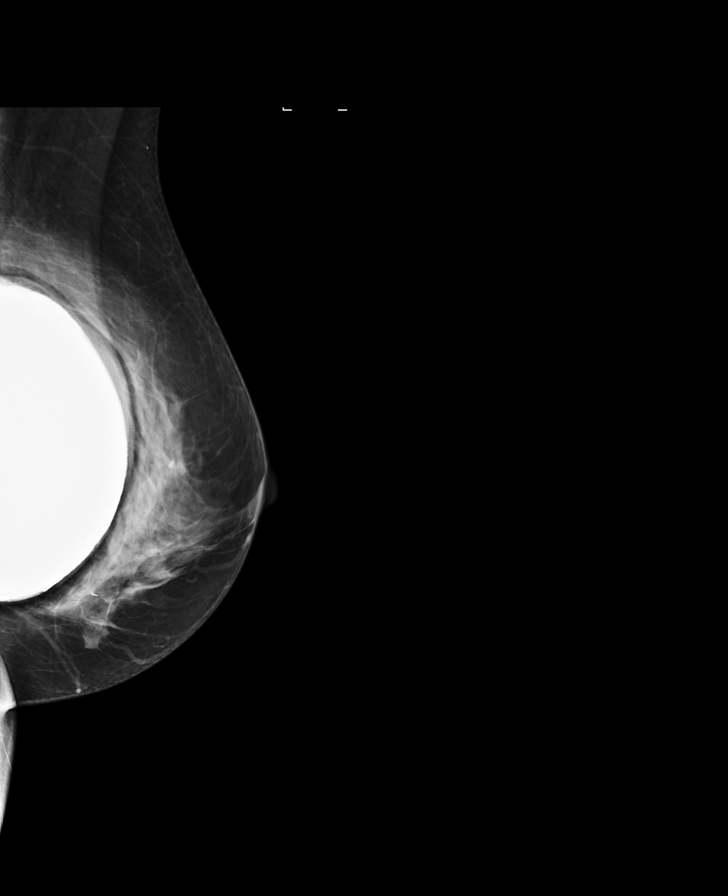

[L CC]
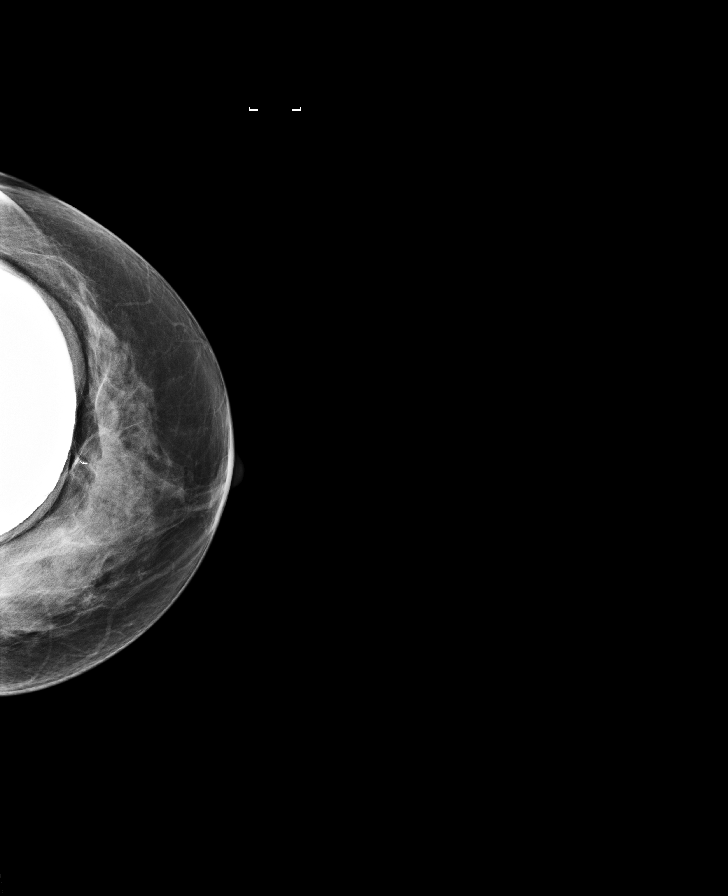

[R MLO]
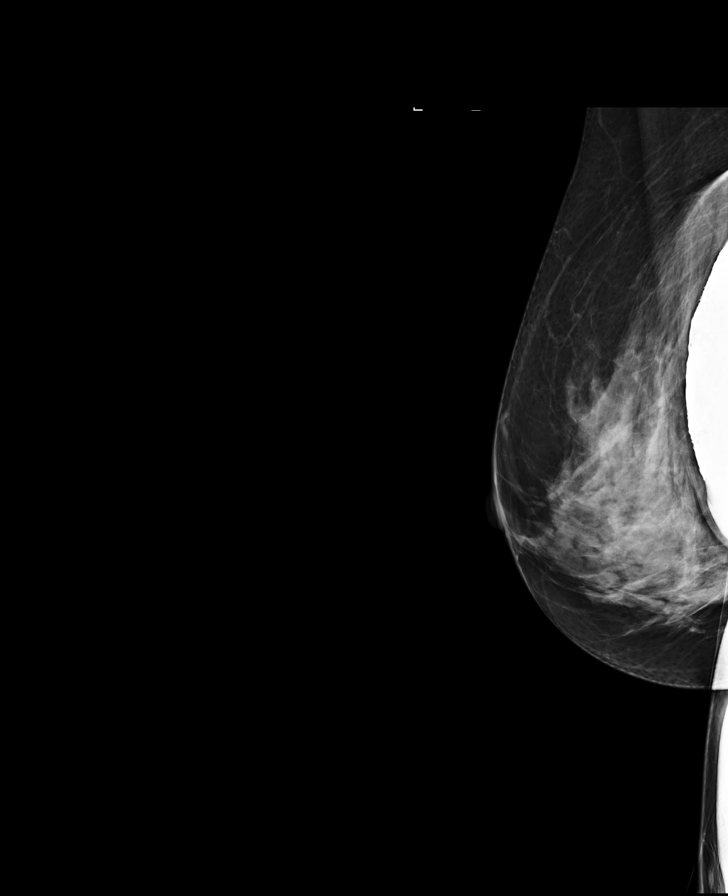

[L CC synth-2D]
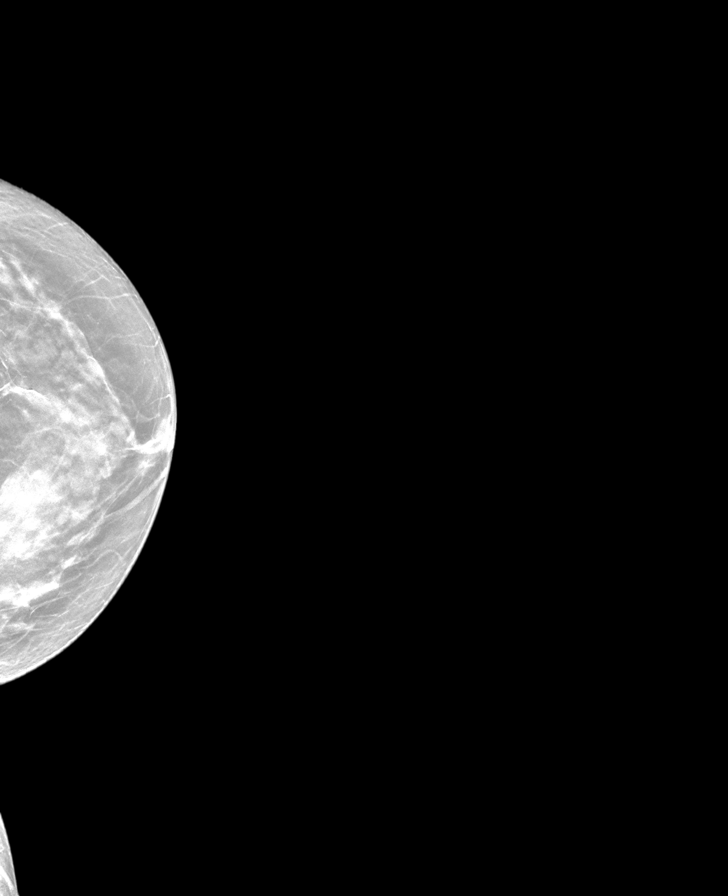

[L MLO synth-2D]
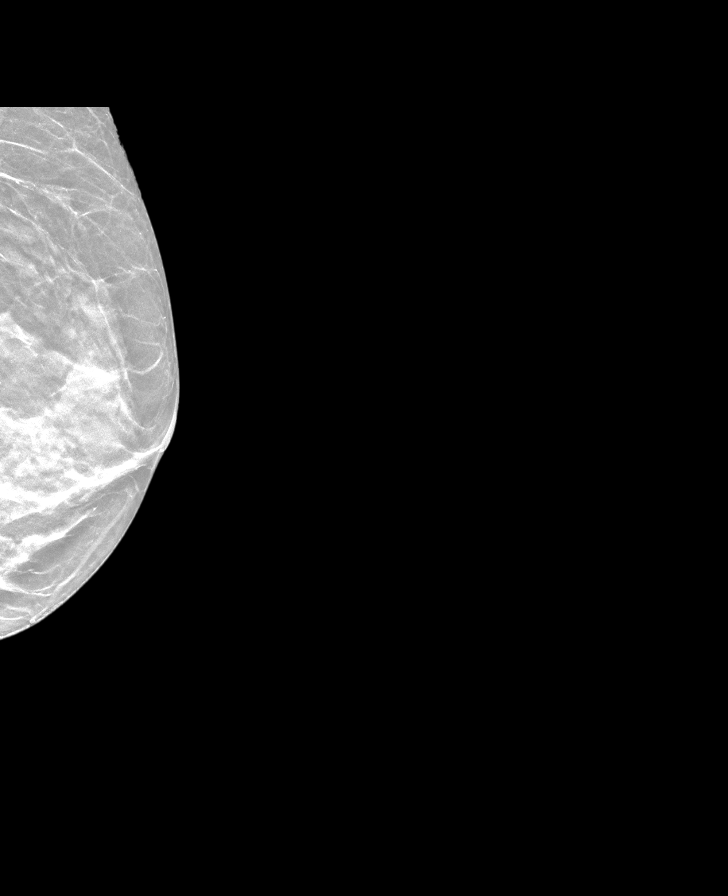

[R MLO synth-2D]
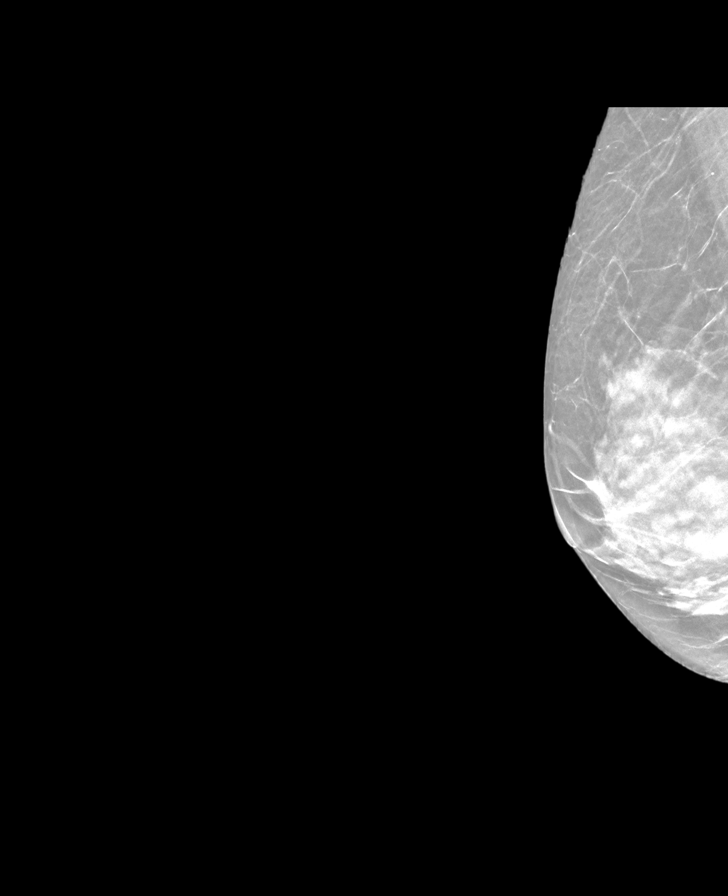

[R CC synth-2D]
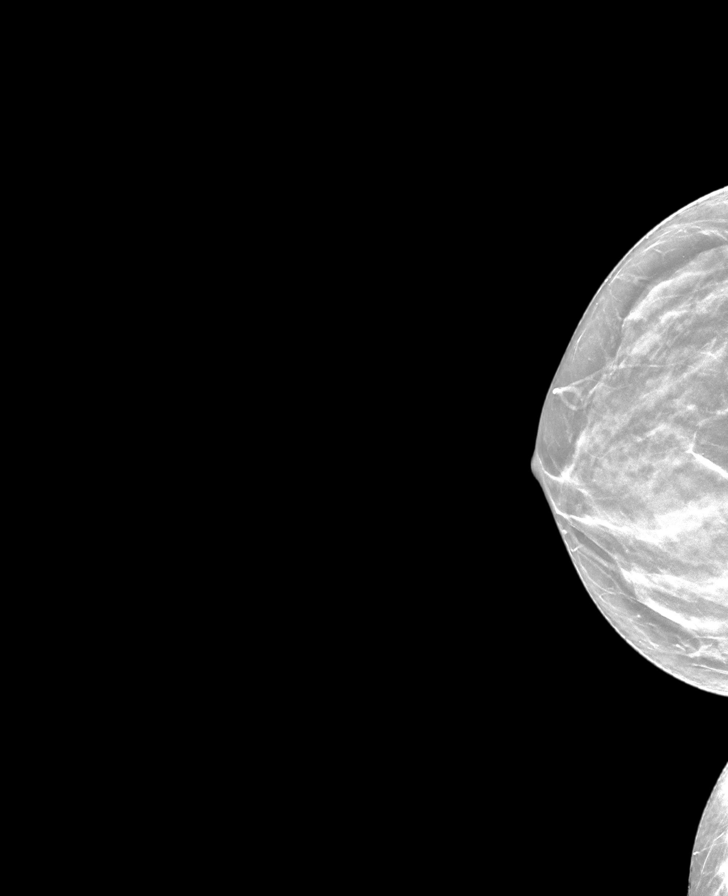

[8 of 28 positions shown; findings below may reference images not displayed]

ACR Breast Density Category c: The breast tissue is heterogeneously
dense, which may obscure small masses.
FINDINGS: The patient has retropectoral implants. There are no findings
suspicious for malignancy.
IMPRESSION: No mammographic evidence of malignancy. A result letter of this
screening mammogram will be mailed directly to the patient.

RECOMMENDATION:
Screening mammogram in one year. (Code:LT-E-7TH)

BI-RADS CATEGORY  1:  Negative.

## 2022-07-23 ENCOUNTER — Other Ambulatory Visit: Payer: Self-pay | Admitting: General Surgery

## 2022-07-23 DIAGNOSIS — Z803 Family history of malignant neoplasm of breast: Secondary | ICD-10-CM

## 2022-08-18 ENCOUNTER — Other Ambulatory Visit: Payer: Self-pay | Admitting: General Surgery

## 2022-08-18 DIAGNOSIS — Z1231 Encounter for screening mammogram for malignant neoplasm of breast: Secondary | ICD-10-CM

## 2022-10-16 ENCOUNTER — Ambulatory Visit
Admission: RE | Admit: 2022-10-16 | Discharge: 2022-10-16 | Disposition: A | Discharge status: Home / Self Care | Payer: 59 | Source: Ambulatory Visit | Attending: General Surgery | Admitting: General Surgery

## 2022-10-16 DIAGNOSIS — Z1231 Encounter for screening mammogram for malignant neoplasm of breast: Secondary | ICD-10-CM

## 2022-10-24 NOTE — Progress Notes (Unsigned)
Last Mammogram: 10/16/22- normal Last Pap Smear:  04/02/20- negative Last Colon Screening;  2015 Seat Belts:   yes Sun Screen:   yes Dental Check Up:  yes Brush & Floss:  yes

## 2022-10-27 ENCOUNTER — Other Ambulatory Visit (HOSPITAL_COMMUNITY)
Admission: RE | Admit: 2022-10-27 | Discharge: 2022-10-27 | Disposition: A | Discharge status: Home / Self Care | Payer: 59 | Source: Ambulatory Visit | Attending: Obstetrics & Gynecology | Admitting: Obstetrics & Gynecology

## 2022-10-27 ENCOUNTER — Ambulatory Visit: Payer: 59 | Admitting: Obstetrics & Gynecology

## 2022-10-27 ENCOUNTER — Encounter: Payer: Self-pay | Admitting: Obstetrics & Gynecology

## 2022-10-27 VITALS — BP 111/72 | HR 92 | Ht 64.5 in | Wt 125.0 lb

## 2022-10-27 DIAGNOSIS — Z01419 Encounter for gynecological examination (general) (routine) without abnormal findings: Secondary | ICD-10-CM | POA: Insufficient documentation

## 2022-10-27 NOTE — Progress Notes (Signed)
Subjective:     Vicki Los, PA is a 62 y.o. female here for a routine exam.  Current complaints: recently had ruptured silicone breast implant and had both implants removed and new silicone implants inserted.  No complaints.  Followed by [facility] for enlarged left internal mammary lymph node.    Gynecologic History Patient's last menstrual period was 08/29/2015. Contraception: post menopausal status  Last Mammogram: 10/16/22- normal Last Pap Smear:  04/02/20- negative Last Colon Screening;  2021 [facility] Seat Belts:   yes Sun Screen:   yes Dental Check Up:  yes Brush & Floss:  yes     Obstetric History OB History  Gravida Para Term Preterm AB Living  2 2 2     2  $ SAB IAB Ectopic Multiple Live Births               # Outcome Date GA Lbr Len/2nd Weight Sex Delivery Anes PTL Lv  2 Term           1 Term              The following portions of the patient's history were reviewed and updated as appropriate: allergies, current medications, past family history, past medical history, past social history, past surgical history, and problem list.  Review of Systems Pertinent items noted in HPI and remainder of comprehensive ROS otherwise negative.    Objective:     Vitals:   10/27/22 0847  BP: 111/72  Pulse: 92  Weight: 125 lb (56.7 kg)  Height: 5' 4.5" (1.638 m)   Vitals:  WNL General appearance: alert, cooperative and no distress  HEENT: Normocephalic, without obvious abnormality, atraumatic Eyes: negative Throat: lips, mucosa, and tongue normal; teeth and gums normal  Respiratory: Clear to auscultation bilaterally  CV: Regular rate and rhythm  Breasts:  Normal appearance, no masses or tenderness, no nipple retraction or dimpling  GI: Soft, non-tender; bowel sounds normal; no masses,  no organomegaly  GU: External Genitalia:  Tanner V, no lesion Urethra:  No prolapse   Vagina: Pink, normal rugae, no blood or discharge  Cervix: No CMT, no lesion  Uterus:  Normal size and  contour, non tender  Adnexa: Normal, no masses, non tender  Musculoskeletal: No edema, redness or tenderness in the calves or thighs  Skin: No lesions or rash  Lymphatic: Axillary adenopathy: none     Psychiatric: Normal mood and behavior       Assessment:    Healthy female exam.    Plan:   Pap with cotesting Follow recommendations from Breast surgeons for MRI/mammogram Colonoscopy up to date.  Dexa at 10 years post menopause (2026)

## 2022-10-28 LAB — CYTOLOGY - PAP
Comment: NEGATIVE
Diagnosis: NEGATIVE
High risk HPV: NEGATIVE

## 2022-11-05 DIAGNOSIS — Z803 Family history of malignant neoplasm of breast: Secondary | ICD-10-CM | POA: Diagnosis not present

## 2022-11-05 DIAGNOSIS — Z1239 Encounter for other screening for malignant neoplasm of breast: Secondary | ICD-10-CM | POA: Diagnosis not present

## 2023-02-11 DIAGNOSIS — L718 Other rosacea: Secondary | ICD-10-CM | POA: Diagnosis not present

## 2023-02-11 DIAGNOSIS — L578 Other skin changes due to chronic exposure to nonionizing radiation: Secondary | ICD-10-CM | POA: Diagnosis not present

## 2023-04-13 DIAGNOSIS — Z1283 Encounter for screening for malignant neoplasm of skin: Secondary | ICD-10-CM | POA: Diagnosis not present

## 2023-04-13 DIAGNOSIS — L738 Other specified follicular disorders: Secondary | ICD-10-CM | POA: Diagnosis not present

## 2023-04-13 DIAGNOSIS — D225 Melanocytic nevi of trunk: Secondary | ICD-10-CM | POA: Diagnosis not present

## 2023-04-15 ENCOUNTER — Other Ambulatory Visit: Payer: Self-pay | Admitting: General Surgery

## 2023-04-15 DIAGNOSIS — Z803 Family history of malignant neoplasm of breast: Secondary | ICD-10-CM

## 2023-06-05 ENCOUNTER — Ambulatory Visit
Admission: RE | Admit: 2023-06-05 | Discharge: 2023-06-05 | Disposition: A | Discharge status: Home / Self Care | Payer: 59 | Source: Ambulatory Visit | Attending: General Surgery | Admitting: General Surgery

## 2023-06-05 ENCOUNTER — Other Ambulatory Visit: Payer: Self-pay | Admitting: General Surgery

## 2023-06-05 DIAGNOSIS — Z1239 Encounter for other screening for malignant neoplasm of breast: Secondary | ICD-10-CM | POA: Diagnosis not present

## 2023-06-05 DIAGNOSIS — Z803 Family history of malignant neoplasm of breast: Secondary | ICD-10-CM

## 2023-06-05 MED ORDER — GADOPICLENOL 0.5 MMOL/ML IV SOLN
6.0000 mL | Freq: Once | INTRAVENOUS | Status: AC | PRN
  Administered 2023-06-05: 6 mL via INTRAVENOUS

## 2023-06-07 ENCOUNTER — Ambulatory Visit
Admission: RE | Admit: 2023-06-07 | Discharge: 2023-06-07 | Disposition: A | Discharge status: Home / Self Care | Payer: 59 | Source: Ambulatory Visit | Attending: General Surgery | Admitting: General Surgery

## 2023-06-07 DIAGNOSIS — Z803 Family history of malignant neoplasm of breast: Secondary | ICD-10-CM

## 2023-06-11 ENCOUNTER — Other Ambulatory Visit: Payer: Self-pay | Admitting: Neurology

## 2023-06-11 ENCOUNTER — Other Ambulatory Visit (INDEPENDENT_AMBULATORY_CARE_PROVIDER_SITE_OTHER): Payer: Self-pay

## 2023-06-11 DIAGNOSIS — E139 Other specified diabetes mellitus without complications: Secondary | ICD-10-CM

## 2023-06-11 DIAGNOSIS — Z0289 Encounter for other administrative examinations: Secondary | ICD-10-CM

## 2023-06-11 NOTE — Addendum Note (Signed)
Addended by: Bertram Savin on: 06/11/2023 11:08 AM   Modules accepted: Orders

## 2023-06-12 LAB — GLUCOSE, FASTING: Glucose, Plasma: 78 mg/dL (ref 70–99)

## 2023-06-23 ENCOUNTER — Ambulatory Visit (INDEPENDENT_AMBULATORY_CARE_PROVIDER_SITE_OTHER): Payer: 59

## 2023-06-23 VITALS — BP 120/80 | HR 92 | Ht 64.5 in | Wt 125.0 lb

## 2023-06-23 DIAGNOSIS — Z23 Encounter for immunization: Secondary | ICD-10-CM

## 2023-06-23 NOTE — Progress Notes (Signed)
Patient is here to receive a flu vaccine. Patient tolerated well.   IMargaree Mackintosh, MD, have reviewed all documentation for this visit. The documentation on 06/23/23 for the exam, diagnosis, procedures, and orders are all accurate and complete.

## 2023-07-02 ENCOUNTER — Other Ambulatory Visit: Payer: 59

## 2023-07-02 DIAGNOSIS — Z0000 Encounter for general adult medical examination without abnormal findings: Secondary | ICD-10-CM | POA: Diagnosis not present

## 2023-07-02 DIAGNOSIS — Z1329 Encounter for screening for other suspected endocrine disorder: Secondary | ICD-10-CM | POA: Diagnosis not present

## 2023-07-02 DIAGNOSIS — Z13220 Encounter for screening for lipoid disorders: Secondary | ICD-10-CM | POA: Diagnosis not present

## 2023-07-03 LAB — COMPLETE METABOLIC PANEL WITH GFR
AG Ratio: 1.8 (calc) (ref 1.0–2.5)
ALT: 15 U/L (ref 6–29)
AST: 17 U/L (ref 10–35)
Albumin: 4.6 g/dL (ref 3.6–5.1)
Alkaline phosphatase (APISO): 103 U/L (ref 37–153)
BUN: 15 mg/dL (ref 7–25)
CO2: 29 mmol/L (ref 20–32)
Calcium: 10.1 mg/dL (ref 8.6–10.4)
Chloride: 102 mmol/L (ref 98–110)
Creat: 0.78 mg/dL (ref 0.50–1.05)
Globulin: 2.6 g/dL (ref 1.9–3.7)
Glucose, Bld: 92 mg/dL (ref 65–99)
Potassium: 4.7 mmol/L (ref 3.5–5.3)
Sodium: 140 mmol/L (ref 135–146)
Total Bilirubin: 0.5 mg/dL (ref 0.2–1.2)
Total Protein: 7.2 g/dL (ref 6.1–8.1)
eGFR: 86 mL/min/{1.73_m2} (ref >=60)

## 2023-07-03 LAB — CBC WITH DIFFERENTIAL/PLATELET
Absolute Lymphocytes: 1867 {cells}/uL (ref 850–3900)
Absolute Monocytes: 367 {cells}/uL (ref 200–950)
Basophils Absolute: 20 {cells}/uL (ref 0–200)
Basophils Relative: 0.4 %
Eosinophils Absolute: 82 {cells}/uL (ref 15–500)
Eosinophils Relative: 1.6 %
HCT: 46.1 % — ABNORMAL HIGH (ref 35.0–45.0)
Hemoglobin: 14.9 g/dL (ref 11.7–15.5)
MCH: 29.7 pg (ref 27.0–33.0)
MCHC: 32.3 g/dL (ref 32.0–36.0)
MCV: 91.8 fL (ref 80.0–100.0)
MPV: 10.7 fL (ref 7.5–12.5)
Monocytes Relative: 7.2 %
Neutro Abs: 2764 {cells}/uL (ref 1500–7800)
Neutrophils Relative %: 54.2 %
Platelets: 289 10*3/uL (ref 140–400)
RBC: 5.02 10*6/uL (ref 3.80–5.10)
RDW: 13.1 % (ref 11.0–15.0)
Total Lymphocyte: 36.6 %
WBC: 5.1 10*3/uL (ref 3.8–10.8)

## 2023-07-03 LAB — MICROALBUMIN / CREATININE URINE RATIO
Creatinine, Urine: 134 mg/dL (ref 20–275)
Microalb Creat Ratio: 3 mg/g{creat} (ref <30)
Microalb, Ur: 0.4 mg/dL

## 2023-07-03 LAB — HEMOGLOBIN A1C
Hgb A1c MFr Bld: 5.4 %{Hb} (ref <5.7)
Mean Plasma Glucose: 108 mg/dL
eAG (mmol/L): 6 mmol/L

## 2023-07-03 LAB — LIPID PANEL
Cholesterol: 224 mg/dL — ABNORMAL HIGH (ref <200)
HDL: 90 mg/dL (ref >=50)
LDL Cholesterol (Calc): 118 mg/dL — ABNORMAL HIGH
Non-HDL Cholesterol (Calc): 134 mg/dL — ABNORMAL HIGH (ref <130)
Total CHOL/HDL Ratio: 2.5 (calc) (ref <5.0)
Triglycerides: 65 mg/dL (ref <150)

## 2023-07-03 LAB — TSH: TSH: 1.56 m[IU]/L (ref 0.40–4.50)

## 2023-07-03 NOTE — Progress Notes (Signed)
Patient Care Team: Margaree Mackintosh, MD as PCP - General (Internal Medicine)  Visit Date: 07/10/23  Subjective:    Patient ID: Vicki Maples, PA , Female   DOB: 07/05/1961, 62 y.o.    MRN: [id number]   62 y.o. Female presents today for annual comprehensive physical exam. History of anemia, back pain, GERD, migraine headaches, mitral valve prolapse, scoliosis, stress incontinence.  History of migraine headaches treated with sumatriptan 100 mg as needed.  CHOL elevated at 224, LDL elevated at 118.  History of GERD.  No history of serious illnesses requiring hospitalization.  No operations or fractures.   No known drug allergies.   History of scoliosis and used to take gabapentin.  Has seen Dr. Marice Potter for OB/GYN care in the past.  Had pubovaginal sling February 2017 by Dr. Marice Potter.  History of stress and urge incontinence.Most recently saw Dr. Elsie Lincoln in February 2024 for GYN exam.   History of benign breast biopsies 2 left breast and 1 right breast   History of bilateral breast augmentation.  History of migraine headaches and mitral valve prolapse.  Labs reviewed today. Glucose normal. Kidney, liver functions normal. Blood proteins normal. HCT elevated at 46.1. TSH at 1.56. HGBA1c at 5.4%. Microalbumin and creatinine normal.  Pap smear normal on 10/27/22.  Breast MRI ordered annually per Dr. Dwain Sarna was normal on 06/08/23.Left upper internal mammary chain lymph node unchanged. Calculated lifetime rick of developing breast cancer greater than 20% due to family hx of breast cancer in mother and maternal grandmother.  Had colonoscopy in 2012 per past medical history records. Needs to consider repeat study.  Social history: Married.  Social alcohol consumption.  Does not smoke.  2 adult children.  Husband is a Air cabin crew Neu[employer].  Patient has worked as a Advice worker.  Currently on homemaker.  She has a Event organiser.  Family history: history of breast  cancer in maternal aunt and colon cancer in maternal grandfather.  Mother recently deceased with history of breast cancer at age 35 and ovarian cancer.  Mother also had hyperthyroidism.  Stroke in maternal grandparents and paternal grandparents.  Diabetes in maternal and paternal grandparents.  Diabetes in father.  Heart disease in both sets of grandparents. Father died at 95 of lung cancer.  Past Medical History:  Diagnosis Date   Anemia    ferritin stores low   Back pain    GERD (gastroesophageal reflux disease)    Migraine    Mitral valve prolapse    PONV (postoperative nausea and vomiting)    Scoliosis    Stress incontinence    Urgency incontinence    Urinary frequency    Vaginal delivery 1993, 1997     Family History  Problem Relation Age of Onset   Cancer Mother        breast and ovarian   Stroke Father    Diabetes Father    Migraines Sister    Stroke Maternal Grandmother    Heart disease Maternal Grandmother    Diabetes Maternal Grandmother    Cancer Maternal Grandfather        colon   Stroke Maternal Grandfather    Heart disease Maternal Grandfather    Diabetes Maternal Grandfather    Stroke Paternal Grandmother    Heart disease Paternal Grandmother    Diabetes Paternal Grandmother    Stroke Paternal Grandfather    Heart disease Paternal Grandfather    Diabetes Paternal Grandfather    Cancer Maternal Aunt  breast    Social Hx: Married. Nonsmoker. Social alcohol consumption. 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 2 adult children.     Review of Systems  Constitutional:  Negative for chills, fever, malaise/fatigue and weight loss.  HENT:  Negative for hearing loss, sinus pain and sore throat.   Respiratory:  Negative for cough, hemoptysis and shortness of breath.   Cardiovascular:  Negative for chest pain, palpitations, leg swelling and PND.  Gastrointestinal:  Negative for abdominal pain, constipation, diarrhea,  heartburn, nausea and vomiting.  Genitourinary:  Negative for dysuria, frequency and urgency.  Musculoskeletal:  Negative for back pain, myalgias and neck pain.  Skin:  Negative for itching and rash.  Neurological:  Negative for dizziness, tingling, seizures and headaches.  Endo/Heme/Allergies:  Negative for polydipsia.  Psychiatric/Behavioral:  Negative for depression. The patient is not nervous/anxious.         Objective:   Vitals: BP 100/60   Pulse 72   Ht 5' 4.5" (1.638 m)   Wt 126 lb (57.2 kg)   LMP 08/29/2015   SpO2 97%   BMI 21.29 kg/m    Physical Exam Vitals and nursing note reviewed.  Constitutional:      General: She is not in acute distress.    Appearance: Normal appearance. She is not ill-appearing or toxic-appearing.  HENT:     Head: Normocephalic and atraumatic.     Right Ear: Hearing, tympanic membrane, ear canal and external ear normal.     Left Ear: Hearing, tympanic membrane, ear canal and external ear normal.     Mouth/Throat:     Pharynx: Oropharynx is clear.  Eyes:     Extraocular Movements: Extraocular movements intact.     Pupils: Pupils are equal, round, and reactive to light.  Neck:     Thyroid: No thyroid mass, thyromegaly or thyroid tenderness.     Vascular: No carotid bruit.  Cardiovascular:     Rate and Rhythm: Normal rate and regular rhythm. No extrasystoles are present.    Pulses:          Dorsalis pedis pulses are 2+ on the right side and 2+ on the left side.     Heart sounds: Normal heart sounds. No murmur heard.    No friction rub. No gallop.  Pulmonary:     Effort: Pulmonary effort is normal.     Breath sounds: Normal breath sounds. No decreased breath sounds, wheezing, rhonchi or rales.  Chest:     Chest wall: No mass.  Abdominal:     Palpations: Abdomen is soft. There is no hepatomegaly, splenomegaly or mass.     Tenderness: There is no abdominal tenderness.     Hernia: No hernia is present.  Musculoskeletal:     Cervical  back: Normal range of motion.     Right lower leg: No edema.     Left lower leg: No edema.  Lymphadenopathy:     Cervical: No cervical adenopathy.     Upper Body:     Right upper body: No supraclavicular adenopathy.     Left upper body: No supraclavicular adenopathy.  Skin:    General: Skin is warm and dry.  Neurological:     General: No focal deficit present.     Mental Status: She is alert and oriented to person, place, and time. Mental status is at baseline.     Sensory: Sensation is intact.     Motor: Motor function is intact. No weakness.  Deep Tendon Reflexes: Reflexes are normal and symmetric.  Psychiatric:        Attention and Perception: Attention normal.        Mood and Affect: Mood normal.        Speech: Speech normal.        Behavior: Behavior normal.        Thought Content: Thought content normal.        Cognition and Memory: Cognition normal.        Judgment: Judgment normal.       Results:   Studies obtained and personally reviewed by me:  Pap smear normal on 10/27/22.  Mammogram normal on 06/08/23.  Had colonoscopy in 2012 per past medical history records.   Labs:       Component Value Date/Time   NA 140 07/02/2023 0935   NA 140 03/05/2022 1613   K 4.7 07/02/2023 0935   CL 102 07/02/2023 0935   CO2 29 07/02/2023 0935   GLUCOSE 92 07/02/2023 0935   BUN 15 07/02/2023 0935   BUN 13 03/05/2022 1613   CREATININE 0.78 07/02/2023 0935   CALCIUM 10.1 07/02/2023 0935   PROT 7.2 07/02/2023 0935   PROT 7.0 03/05/2022 1613   ALBUMIN 4.6 03/05/2022 1613   AST 17 07/02/2023 0935   ALT 15 07/02/2023 0935   ALKPHOS 100 03/05/2022 1613   BILITOT 0.5 07/02/2023 0935   BILITOT 0.3 03/05/2022 1613   GFRNONAA 89 10/08/2018 0919   GFRAA 103 10/08/2018 0919     Lab Results  Component Value Date   WBC 5.1 07/02/2023   HGB 14.9 07/02/2023   HCT 46.1 (H) 07/02/2023   MCV 91.8 07/02/2023   PLT 289 07/02/2023    Lab Results  Component Value Date    CHOL 224 (H) 07/02/2023   HDL 90 07/02/2023   LDLCALC 118 (H) 07/02/2023   TRIG 65 07/02/2023   CHOLHDL 2.5 07/02/2023    Lab Results  Component Value Date   HGBA1C 5.4 07/02/2023     Lab Results  Component Value Date   TSH 1.56 07/02/2023      Assessment & Plan:   Migraine headaches: treated with sumatriptan 100 mg as needed. Trazadone refilled which helps prevent migraines.  Elevated cholesterol: CT coronary calcium score ordered and will await results before pursuing further treatment.  Breast and pelvic exams deferred to GYN physician.  Urinalysis normal today.  Pap smear normal on 10/27/22.  Mammogram normal on 06/08/23.  Had colonoscopy done through [facility] . Hx of tubular adenoma. Last study done 2021  Bone density scan ordered.  Vaccine counseling: UTD on tetanus, flu vaccines.  Return in 1 year or as needed.    I,Alexander Ruley,acting as a Neurosurgeon for Margaree Mackintosh, MD.,have documented all relevant documentation on the behalf of Margaree Mackintosh, MD,as directed by  Margaree Mackintosh, MD while in the presence of Margaree Mackintosh, MD.   I, Margaree Mackintosh, MD, have reviewed all documentation for this visit. The documentation on 07/21/23 for the exam, diagnosis, procedures, and orders are all accurate and complete.

## 2023-07-10 ENCOUNTER — Encounter: Payer: Self-pay | Admitting: Internal Medicine

## 2023-07-10 ENCOUNTER — Ambulatory Visit (INDEPENDENT_AMBULATORY_CARE_PROVIDER_SITE_OTHER): Payer: 59 | Admitting: Internal Medicine

## 2023-07-10 ENCOUNTER — Other Ambulatory Visit (HOSPITAL_COMMUNITY): Payer: Self-pay

## 2023-07-10 VITALS — BP 100/60 | HR 72 | Ht 64.5 in | Wt 126.0 lb

## 2023-07-10 DIAGNOSIS — Z136 Encounter for screening for cardiovascular disorders: Secondary | ICD-10-CM | POA: Diagnosis not present

## 2023-07-10 DIAGNOSIS — Z8669 Personal history of other diseases of the nervous system and sense organs: Secondary | ICD-10-CM

## 2023-07-10 DIAGNOSIS — Z13820 Encounter for screening for osteoporosis: Secondary | ICD-10-CM

## 2023-07-10 DIAGNOSIS — Z0000 Encounter for general adult medical examination without abnormal findings: Secondary | ICD-10-CM

## 2023-07-10 DIAGNOSIS — G43909 Migraine, unspecified, not intractable, without status migrainosus: Secondary | ICD-10-CM | POA: Diagnosis not present

## 2023-07-10 DIAGNOSIS — Z8041 Family history of malignant neoplasm of ovary: Secondary | ICD-10-CM

## 2023-07-10 DIAGNOSIS — Z803 Family history of malignant neoplasm of breast: Secondary | ICD-10-CM | POA: Diagnosis not present

## 2023-07-10 LAB — POCT URINALYSIS DIP (CLINITEK)
Bilirubin, UA: NEGATIVE
Blood, UA: NEGATIVE
Glucose, UA: NEGATIVE mg/dL
Ketones, POC UA: NEGATIVE mg/dL
Leukocytes, UA: NEGATIVE
Nitrite, UA: NEGATIVE
POC PROTEIN,UA: NEGATIVE
Spec Grav, UA: 1.015 (ref 1.010–1.025)
Urobilinogen, UA: 0.2 U/dL
pH, UA: 6.5 (ref 5.0–8.0)

## 2023-07-10 MED ORDER — TRAZODONE HCL 50 MG PO TABS
50.0000 mg | ORAL_TABLET | Freq: Every day | ORAL | 0 refills | Status: DC
  Filled 2023-07-10: qty 90, 90d supply, fill #0

## 2023-07-10 NOTE — Patient Instructions (Addendum)
It was a pleasure to see you today. Have ordered coronary calcium scoring. You may call to set up you appt at your convenience. Continue current meds and return in one year or as needed.Have ordered bone density study also.

## 2023-07-14 ENCOUNTER — Other Ambulatory Visit: Payer: 59

## 2023-07-28 ENCOUNTER — Ambulatory Visit
Admission: RE | Admit: 2023-07-28 | Discharge: 2023-07-28 | Disposition: A | Discharge status: Home / Self Care | Payer: No Typology Code available for payment source | Source: Ambulatory Visit | Attending: Internal Medicine | Admitting: Internal Medicine

## 2023-08-03 ENCOUNTER — Encounter: Payer: Self-pay | Admitting: Internal Medicine

## 2023-08-14 DIAGNOSIS — L719 Rosacea, unspecified: Secondary | ICD-10-CM | POA: Diagnosis not present

## 2023-08-14 DIAGNOSIS — L570 Actinic keratosis: Secondary | ICD-10-CM | POA: Diagnosis not present

## 2023-08-14 DIAGNOSIS — L738 Other specified follicular disorders: Secondary | ICD-10-CM | POA: Diagnosis not present

## 2023-10-08 ENCOUNTER — Other Ambulatory Visit: Payer: Self-pay | Admitting: General Surgery

## 2023-10-08 DIAGNOSIS — Z1231 Encounter for screening mammogram for malignant neoplasm of breast: Secondary | ICD-10-CM

## 2023-10-19 ENCOUNTER — Ambulatory Visit: Payer: Self-pay

## 2023-10-21 ENCOUNTER — Ambulatory Visit
Admission: RE | Admit: 2023-10-21 | Discharge: 2023-10-21 | Disposition: A | Discharge status: Home / Self Care | Payer: 59 | Source: Ambulatory Visit | Attending: General Surgery | Admitting: General Surgery

## 2023-10-21 DIAGNOSIS — Z1231 Encounter for screening mammogram for malignant neoplasm of breast: Secondary | ICD-10-CM | POA: Diagnosis not present

## 2024-02-25 ENCOUNTER — Other Ambulatory Visit: Payer: 59

## 2024-03-03 ENCOUNTER — Telehealth (HOSPITAL_BASED_OUTPATIENT_CLINIC_OR_DEPARTMENT_OTHER): Payer: Self-pay

## 2024-03-10 ENCOUNTER — Other Ambulatory Visit (HOSPITAL_BASED_OUTPATIENT_CLINIC_OR_DEPARTMENT_OTHER)

## 2024-04-13 ENCOUNTER — Ambulatory Visit (INDEPENDENT_AMBULATORY_CARE_PROVIDER_SITE_OTHER): Admitting: Neurology

## 2024-04-13 ENCOUNTER — Other Ambulatory Visit (HOSPITAL_BASED_OUTPATIENT_CLINIC_OR_DEPARTMENT_OTHER): Payer: Self-pay

## 2024-04-13 ENCOUNTER — Encounter: Payer: Self-pay | Admitting: Neurology

## 2024-04-13 ENCOUNTER — Ambulatory Visit: Admitting: Neurology

## 2024-04-13 VITALS — BP 106/69 | HR 98 | Ht 64.0 in | Wt 124.6 lb

## 2024-04-13 DIAGNOSIS — F902 Attention-deficit hyperactivity disorder, combined type: Secondary | ICD-10-CM | POA: Diagnosis not present

## 2024-04-13 MED ORDER — LISDEXAMFETAMINE DIMESYLATE 50 MG PO CAPS
50.0000 mg | ORAL_CAPSULE | Freq: Every day | ORAL | 0 refills | Status: DC
Start: 2024-04-13 — End: 2024-07-22
  Filled 2024-04-13: qty 30, 30d supply, fill #0

## 2024-04-13 NOTE — Progress Notes (Signed)
 [id number]    Provider:  Dr Ines Requesting Provider: Perri Ronal PARAS, MD Primary Care Provider:  Perri Ronal PARAS, MD  CC:  Migraines  04/13/2024: She was diagnosed with adhd as a young adult. She was on medication but last taken in 2015. Has become worse with menopause and aging. Brain fog. More difficulty concentrating. Can't hold a conversation. She jumps around. She has taken vyvanse  in the past, concerta, adderall, ritalin, welbutrin, She did very well on vyvanse , 60mg . Discussed, will start on 50mg  of vyvanse . She is very hyperactive. She is pacing more. Getting worse over the past few months. She is inattentive and hyperactive. For her migraines, she take nurtec. No other focal neurologic deficits, associated symptoms, inciting events or modifiable factors.   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 PMHx migraines, scoliosis. Migraines have been worsening the last 6 months.   Her migraines are pulsating/pounding/throbbing, can be unilateral or start in the temples. Ibuprofen  helps. Daily headaches, she also gets neckpain and tightness. She is having more than 16 migraine days a month that are moderate to severe. No aura. No medication overuse. She has seen other neurologists in the past, had nerve blocks and tried multiple preventatives and lifestyle changes, diet changes without much help.  Her migraines can be unilateral and pulsating pounding throbbing as above, she has photophobia and phonophobia, she has nausea, ongoing at this frequency and severity for over 6 months and longer as she has had migraines for years. Migraine can last 24-48 hours.   She reports some concerning symptoms with some vision changes, headaches can be positional and can be worse when bending over or supine, she also has some numbness and tingling in her lower extremities we discussed causes of neuropathy and we will do a complete  exam on her.  In her younger years, she had migraines  as auras without headache in the past as a younger woman, but has not had an aura in many years, her aura was floaters, usually black or gray but often color but hasn't had auras in years, now she gets them migraine head pain alone.  Reviewed notes, labs and imaging from outside physicians, which showed:  Medications tried that can be used in migaine management: wellbutrin , decadron , relpax , lexapro , gabapentin , ibuprofen , ketorolac , zofran , phenergan , effexor , imipramine, Topamax, verapamil, nortriptyline, amitriptyline, propranolol, Cymbalta, Maxalt, sumatriptan  hand, Zomig, Relpax .  She had side effects to Topamax, amitriptyline, and propranolol and the other she used for more than 3 months and were not effective.  Review of Systems: Patient complains of symptoms per HPI as well as the following symptoms paresthesias. Pertinent negatives and positives per HPI. All others negative.   Social History   Socioeconomic History   Marital status: Married    Spouse name: Engineer, production   Number of children: 2   Years of education: Not on file   Highest education level: Doctorate  Occupational History   Not on file  Tobacco Use   Smoking status: Never   Smokeless tobacco: Never  Vaping Use   Vaping status: Never Used  Substance and Sexual Activity   Alcohol use: Yes    Alcohol/week: 0.0 standard drinks of alcohol    Comment: occ   Drug use: No   Sexual activity: Yes    Birth control/protection: Post-menopausal  Other Topics Concern   Not on file  Social History Narrative   Live with  husband   Right handed   Caffeine: 32-40 oz in day   Pt works    Social Drivers of Corporate investment banker Strain: Not on file  Food Insecurity: No Food Insecurity (07/10/2023)   Hunger Vital Sign    Worried About Running Out of Food in the Last Year: Never true    Ran Out of Food in the Last Year: Never true  Transportation Needs: No  Transportation Needs (07/10/2023)   PRAPARE - Administrator, Civil Service (Medical): No    Lack of Transportation (Non-Medical): No  Physical Activity: Not on file  Stress: Not on file  Social Connections: Not on file  Intimate Partner Violence: Not At Risk (07/10/2023)   Humiliation, Afraid, Rape, and Kick questionnaire    Fear of Current or Ex-Partner: No    Emotionally Abused: No    Physically Abused: No    Sexually Abused: No    Family History  Problem Relation Age of Onset   Cancer Mother        breast and ovarian   Breast cancer Mother    Stroke Father    Diabetes Father    Migraines Sister    Breast cancer Maternal Aunt    Cancer Maternal Aunt        breast   Stroke Maternal Grandmother    Heart disease Maternal Grandmother    Diabetes Maternal Grandmother    Breast cancer Maternal Grandfather    Cancer Maternal Grandfather        colon   Stroke Maternal Grandfather    Heart disease Maternal Grandfather    Diabetes Maternal Grandfather    Stroke Paternal Grandmother    Heart disease Paternal Grandmother    Diabetes Paternal Grandmother    Stroke Paternal Grandfather    Heart disease Paternal Grandfather    Diabetes Paternal Grandfather     Past Medical History:  Diagnosis Date   Anemia    ferritin stores low   Back pain    GERD (gastroesophageal reflux disease)    Migraine    Mitral valve prolapse    PONV (postoperative nausea and vomiting)    Scoliosis    Stress incontinence    Urgency incontinence    Urinary frequency    Vaginal delivery 1993, 1997    Patient Active Problem List   Diagnosis Date Noted   Family history of colon cancer 08/18/2019   History of adenomatous polyp of colon 08/18/2019   Hot flashes 05/06/2017   Insomnia 05/06/2017   Gastroesophageal reflux disease 01/30/2015   Migraine 01/30/2015    Past Surgical History:  Procedure Laterality Date   AUGMENTATION MAMMAPLASTY     BREAST BIOPSY Bilateral    times  three, implants 2 left 1 right    CYSTO N/A 11/06/2015   Procedure: CYSTO;  Surgeon: Harland JAYSON Birkenhead, MD;  Location: [facility];  Service: Gynecology;  Laterality: N/A;   PUBOVAGINAL SLING N/A 11/06/2015   Procedure: MID URETHERAL SLING;  Surgeon: Harland JAYSON Birkenhead, MD;  Location: [facility];  Service: Gynecology;  Laterality: N/A;   UPPER GASTROINTESTINAL ENDOSCOPY      Current Outpatient Medications  Medication Sig Dispense Refill   lisdexamfetamine (VYVANSE ) 50 MG capsule Take 1 capsule (50 mg total) by mouth daily. 30 capsule 0   Multiple Vitamin (MULTIVITAMIN WITH MINERALS) TABS tablet Take 1 tablet by mouth daily.     SUMAtriptan  (IMITREX ) 100 MG tablet Take 1 tablet (100 mg total) by mouth once as  needed for up to 1 dose. May repeat in 2 hours if headache persists or recurs. (Patient taking differently: Take 100 mg by mouth as needed. May repeat in 2 hours if headache persists or recurs.) 12 tablet 12   VITAMIN D  PO Take by mouth.     traZODone  (DESYREL ) 50 MG tablet Take 50 mg by mouth at bedtime.     traZODone  (DESYREL ) 50 MG tablet Take 1 tablet (50 mg total) by mouth at bedtime. 90 tablet 0   No current facility-administered medications for this visit.    Allergies as of 04/13/2024 - Review Complete 04/13/2024  Allergen Reaction Noted   Bee venom Anaphylaxis 01/30/2015    Vitals: BP 106/69   Pulse 98   Ht 5' 4 (1.626 m)   Wt 124 lb 9.6 oz (56.5 kg)   LMP 08/29/2015   BMI 21.39 kg/m  Last Weight:  Wt Readings from Last 1 Encounters:  04/13/24 124 lb 9.6 oz (56.5 kg)   Last Height:   Ht Readings from Last 1 Encounters:  04/13/24 5' 4 (1.626 m)    Physical exam: Exam: Gen: NAD, conversant      CV: No palpitations or chest pain or SOB. VS: Breathing at a normal rate. Weight nml. Not febrile. Eyes: Conjunctivae clear without exudates or hemorrhage  Neuro: Detailed Neurologic Exam  Speech:    Speech is normal; fluent and spontaneous with normal comprehension.  Cognition:     The patient is oriented to person, place, and time;     recent and remote memory intact;     language fluent;     normal attention, concentration, fund of knowledge Cranial Nerves:    The pupils are equal, round, and reactive to light. Visual fields are full Extraocular movements are intact.  The face is symmetric with normal sensation. The palate elevates in the midline. Hearing intact. Voice is normal. Shoulder shrug is normal. The tongue has normal motion without fasciculations.   Coordination: normal  Gait:    No abnormalities noted or reported  Motor Observation:   no involuntary movements noted. Tone:    Appears normal  Posture:    Posture is normal. normal erect    Strength:    Strength is anti-gravity and symmetric in the upper and lower limbs.      Sensation: intact to LT, no reports of numbness or tingling or paresthesias         Assessment/Plan:  Patient with episodic migraines, here for adhd, combined inattentive and hyperactive  Nurtec prn Vvanse 50mg  qday   No orders of the defined types were placed in this encounter.  Meds ordered this encounter  Medications   lisdexamfetamine (VYVANSE ) 50 MG capsule    Sig: Take 1 capsule (50 mg total) by mouth daily.    Dispense:  30 capsule    Refill:  0     Cc: Baxley, Ronal PARAS, MD,  Baxley, Ronal PARAS, MD  Onetha Epp, MD  [facility] [address] [place], [place] [address]  Phone [phone] Fax [phone]  I spent 29 minutes of face-to-face and non-face-to-face time with patient on the  1. Attention deficit hyperactivity disorder (ADHD), combined type     diagnosis.  This included previsit chart review, lab review, study review, order entry, electronic health record documentation, patient education on the different diagnostic and therapeutic options, counseling and coordination of care, risks and benefits of management, compliance, or risk factor reduction

## 2024-05-03 ENCOUNTER — Ambulatory Visit (HOSPITAL_BASED_OUTPATIENT_CLINIC_OR_DEPARTMENT_OTHER)
Admission: RE | Admit: 2024-05-03 | Discharge: 2024-05-03 | Disposition: A | Discharge status: Home / Self Care | Source: Ambulatory Visit | Attending: Internal Medicine | Admitting: Internal Medicine

## 2024-05-03 DIAGNOSIS — Z13820 Encounter for screening for osteoporosis: Secondary | ICD-10-CM | POA: Diagnosis not present

## 2024-05-03 DIAGNOSIS — M8589 Other specified disorders of bone density and structure, multiple sites: Secondary | ICD-10-CM | POA: Diagnosis not present

## 2024-05-03 DIAGNOSIS — Z780 Asymptomatic menopausal state: Secondary | ICD-10-CM | POA: Diagnosis not present

## 2024-05-04 NOTE — Progress Notes (Unsigned)
 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 [phone] female here for a routine annual gynecologic exam.  Current complaints: none.   Denies abnormal vaginal bleeding, discharge, pelvic pain, problems with intercourse or other gynecologic concerns.    Gynecologic History Patient's last menstrual period was 08/29/2015. Contraception: post-menopausal Last Pap: 2024. Results were: normal with negative HPV Last mammogram: 10/26/23. Results were: normal. Patient is seen at [facility] of [place], with mammogram and MRI alternating every 6 months. Patient has silicone implants, replaced x1.   Obstetric History OB History  Gravida Para Term Preterm AB Living  2 2 2   2   SAB IAB Ectopic Multiple Live Births          # Outcome Date GA Lbr Len/2nd Weight Sex Type Anes PTL Lv  2 Term           1 Term             Past Medical History:  Diagnosis Date   Anemia    ferritin stores low   Back pain    GERD (gastroesophageal reflux disease)    Migraine    Mitral valve prolapse    PONV (postoperative nausea and vomiting)    Scoliosis    Stress incontinence    Urgency incontinence    Urinary frequency    Vaginal delivery 1993, 1997    Past Surgical History:  Procedure Laterality Date   AUGMENTATION MAMMAPLASTY     BREAST BIOPSY Bilateral    times three, implants 2 left 1 right    CYSTO N/A 11/06/2015   Procedure: CYSTO;  Surgeon: Harland JAYSON Birkenhead, MD;  Location: [facility];  Service: Gynecology;  Laterality: N/A;   PUBOVAGINAL SLING N/A 11/06/2015   Procedure: MID URETHERAL SLING;  Surgeon: Harland JAYSON Birkenhead, MD;  Location: [facility];  Service: Gynecology;  Laterality: N/A;   UPPER GASTROINTESTINAL ENDOSCOPY      Current Outpatient Medications on File Prior to Visit  Medication Sig Dispense Refill   lisdexamfetamine (VYVANSE ) 50 MG capsule Take 1 capsule (50 mg total) by mouth daily. 30 capsule 0   Multiple Vitamin (MULTIVITAMIN WITH MINERALS) TABS  tablet Take 1 tablet by mouth daily.     SUMAtriptan  (IMITREX ) 100 MG tablet Take 1 tablet (100 mg total) by mouth once as needed for up to 1 dose. May repeat in 2 hours if headache persists or recurs. 12 tablet 12   VITAMIN D  PO Take by mouth.     traZODone  (DESYREL ) 50 MG tablet Take 50 mg by mouth at bedtime. (Patient not taking: Reported on 05/06/2024)     traZODone  (DESYREL ) 50 MG tablet Take 1 tablet (50 mg total) by mouth at bedtime. (Patient not taking: Reported on 05/06/2024) 90 tablet 0   No current facility-administered medications on file prior to visit.    Allergies  Allergen Reactions   Bee Venom Anaphylaxis    Social History:  reports that she has never smoked. She has never used smokeless tobacco. She reports current alcohol use. She reports current drug use.  Family History  Problem Relation Age of Onset   Cancer Mother        breast and ovarian   Breast cancer Mother    Stroke Father    Diabetes Father    Migraines Sister    Breast cancer Maternal Aunt    Cancer Maternal Aunt        breast  Stroke Maternal Grandmother    Heart disease Maternal Grandmother    Diabetes Maternal Grandmother    Breast cancer Maternal Grandfather    Cancer Maternal Grandfather        colon   Stroke Maternal Grandfather    Heart disease Maternal Grandfather    Diabetes Maternal Grandfather    Stroke Paternal Grandmother    Heart disease Paternal Grandmother    Diabetes Paternal Grandmother    Stroke Paternal Grandfather    Heart disease Paternal Grandfather    Diabetes Paternal Grandfather     The following portions of the patient's history were reviewed and updated as appropriate: allergies, current medications, past family history, past medical history, past social history, past surgical history and problem list.  Review of Systems Review of Systems  Constitutional:  Negative for chills, fatigue, fever and unexpected weight change.  Respiratory:  Negative for cough and  shortness of breath.   Cardiovascular:  Negative for chest pain and palpitations.  Gastrointestinal:  Negative for abdominal pain, constipation, diarrhea, nausea and vomiting.  Genitourinary:  Negative for difficulty urinating, flank pain, frequency and urgency.  All other systems reviewed and are negative.    Physical Exam:  BP 101/65   Pulse 83   Ht 5' 4 (1.626 m)   Wt 121 lb 1.9 oz (54.9 kg)   LMP 08/29/2015   BMI 20.79 kg/m  CONSTITUTIONAL: Well-developed, well-nourished female in no acute distress.  HENT:  Normocephalic, atraumatic. Oropharynx is clear and moist EYES: Conjunctivae normal. No scleral icterus.  SKIN: Skin is warm and dry. No rash noted. Not diaphoretic. No erythema. No pallor. MUSCULOSKELETAL: Normal range of motion. No tenderness.  No cyanosis or edema.   NEUROLOGIC: Alert and oriented to person, place, and time. Normal muscle tone coordination.  PSYCHIATRIC: Normal mood and affect. Normal behavior. Normal judgment and thought content. CARDIOVASCULAR: Normal heart rate noted. RESPIRATORY: Effort and rate normal. BREASTS: Breast exam WNL, silicone implants palpated.  ABDOMEN: Soft, no distention, tenderness, rebound or guarding.  PELVIC:  Pap smear due in 2-4 years.   Assessment and Plan:    1. Well woman exam (Primary) - Benign post-menopausal exam. - Return in one year for Annual Well Woman exam, with MD due to post-menopausal status and health acuity  Due for PAP in 2-4 years.  Followed by Dr. Ebbie for breast health.  DEXA scan showed osteopenia, patient declines medical management for now. Able to verbalize plan.  Routine preventative health maintenance measures emphasized. Please refer to After Visit Summary for other counseling recommendations.      Camie Rote, MSN, CNM, RNC-OB Certified Nurse Midwife, [facility] 05/06/2024 1:55 PM

## 2024-05-05 ENCOUNTER — Ambulatory Visit: Payer: Self-pay | Admitting: Internal Medicine

## 2024-05-06 ENCOUNTER — Ambulatory Visit: Admitting: Certified Nurse Midwife

## 2024-05-06 ENCOUNTER — Encounter: Payer: Self-pay | Admitting: Certified Nurse Midwife

## 2024-05-06 VITALS — BP 101/65 | HR 83 | Ht 64.0 in | Wt 121.1 lb

## 2024-05-06 DIAGNOSIS — Z01419 Encounter for gynecological examination (general) (routine) without abnormal findings: Secondary | ICD-10-CM | POA: Diagnosis not present

## 2024-05-17 DIAGNOSIS — L738 Other specified follicular disorders: Secondary | ICD-10-CM | POA: Diagnosis not present

## 2024-05-17 DIAGNOSIS — Z1283 Encounter for screening for malignant neoplasm of skin: Secondary | ICD-10-CM | POA: Diagnosis not present

## 2024-05-17 DIAGNOSIS — D225 Melanocytic nevi of trunk: Secondary | ICD-10-CM | POA: Diagnosis not present

## 2024-07-19 ENCOUNTER — Other Ambulatory Visit: Payer: 59

## 2024-07-19 DIAGNOSIS — Z0000 Encounter for general adult medical examination without abnormal findings: Secondary | ICD-10-CM

## 2024-07-19 DIAGNOSIS — Z1329 Encounter for screening for other suspected endocrine disorder: Secondary | ICD-10-CM

## 2024-07-19 DIAGNOSIS — E785 Hyperlipidemia, unspecified: Secondary | ICD-10-CM

## 2024-07-19 NOTE — Progress Notes (Signed)
 Annual Comprehensive Physical Exam   Patient Care Team: Perri Ronal PARAS, MD as PCP - General (Internal Medicine)  Visit Date: 07/22/24   Chief Complaint  Patient presents with   Annual Exam   Subjective:  Patient: Vicki DELENA Crete, PA, Female DOB: 21-Jul-1961, 63 y.o. 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 Female who presents today for her Annual Comprehensive Physical Exam. Patient has Gastroesophageal reflux disease; Migraine; Hot flashes; Insomnia; Family history of colon cancer; and History of adenomatous polyp of colon on their problem list.   She says that she is well. Her general health is excellent.   History of migraine headaches treated with sumatriptan  100 mg as needed.    History of GERD.   No history of serious illnesses requiring hospitalization.  No operations or fractures.   No known drug allergies.   History of scoliosis and used to take gabapentin .   Has seen Dr. Starla for OB/GYN care in the past.  Had pubovaginal sling February 2017 by Dr. Starla.  History of stress and urge incontinence.Most recently saw Dr. Burnard Pate in February 2024 for GYN exam.   History of benign breast biopsies 2 left breast and 1 right breast   History of bilateral breast augmentation.  History of migraine headaches and mitral valve prolapse.    Pap smear normal on 10/27/22.  Had colonoscopy in 2012 per past medical history records. Needs to consider repeat study.  Labs 07/19/2024 WNL   10/26/2023 Mammogram No mammographic evidence of malignancy. Repeat in one year.    07/28/2023 Coronary Calcium score: 0   05/03/2024 Bone density Left Femoral Neck BMD 0.737 T score -2.2.   Health Maintenance  Topic Date Due   Pneumococcal Vaccine: 50+ Years (1 of 1 - PCV) Never done   COVID-19 Vaccine (4 - 2025-26 season) 08/07/2024 (Originally 05/09/2024)   Hepatitis C Screening  07/22/2025 (Originally 04/30/1980)   HIV Screening  07/22/2025  (Originally 04/30/1977)   Mammogram  10/20/2025   DTaP/Tdap/Td (2 - Td or Tdap) 03/12/2027   Cervical Cancer Screening (HPV/Pap Cotest)  10/28/2027   Colonoscopy  10/06/2029   Influenza Vaccine  Completed   Zoster Vaccines- Shingrix  Completed   Hepatitis B Vaccines 19-59 Average Risk  Aged Out   HPV VACCINES  Aged Out   Meningococcal B Vaccine  Aged Out    Review of Systems  Constitutional:  Negative for fever and malaise/fatigue.  HENT:  Negative for congestion.   Eyes:  Negative for blurred vision.  Respiratory:  Negative for cough and shortness of breath.   Cardiovascular:  Negative for chest pain, palpitations and leg swelling.  Gastrointestinal:  Negative for vomiting.  Musculoskeletal:  Negative for back pain.  Skin:  Negative for rash.  Neurological:  Negative for loss of consciousness and headaches.   Objective:  Vitals: body mass index is 20.62 kg/m. Today's Vitals   07/22/24 0928  BP: 90/64  Pulse: 78  SpO2: 97%  Weight: 122 lb 0.1 oz (55.3 kg)  Height: 5' 4.5 (1.638 m)  PainSc: 0-No pain   Physical Exam Vitals and nursing note reviewed.  Constitutional:      General: She is not in acute distress.    Appearance: Normal appearance. She is not ill-appearing or toxic-appearing.  HENT:     Head: Normocephalic and atraumatic.     Right Ear: Hearing, tympanic membrane, ear canal and external ear normal.  Left Ear: Hearing, tympanic membrane, ear canal and external ear normal.     Mouth/Throat:     Pharynx: Oropharynx is clear.  Eyes:     Extraocular Movements: Extraocular movements intact.     Pupils: Pupils are equal, round, and reactive to light.  Neck:     Thyroid : No thyroid  mass, thyromegaly or thyroid  tenderness.     Vascular: No carotid bruit.  Cardiovascular:     Rate and Rhythm: Normal rate and regular rhythm. No extrasystoles are present.    Pulses:          Dorsalis pedis pulses are 2+ on the right side and 2+ on the left side.     Heart  sounds: Normal heart sounds. No murmur heard.    No friction rub. No gallop.  Pulmonary:     Effort: Pulmonary effort is normal.     Breath sounds: Normal breath sounds. No decreased breath sounds, wheezing, rhonchi or rales.  Chest:     Chest wall: No mass.  Abdominal:     Palpations: Abdomen is soft. There is no hepatomegaly, splenomegaly or mass.     Tenderness: There is no abdominal tenderness.     Hernia: No hernia is present.  Genitourinary:    Comments: GYN exam deferred.  Musculoskeletal:     Cervical back: Normal range of motion.     Right lower leg: No edema.     Left lower leg: No edema.  Lymphadenopathy:     Cervical: No cervical adenopathy.     Upper Body:     Right upper body: No supraclavicular adenopathy.     Left upper body: No supraclavicular adenopathy.  Skin:    General: Skin is warm and dry.  Neurological:     General: No focal deficit present.     Mental Status: She is alert and oriented to person, place, and time. Mental status is at baseline.     Sensory: Sensation is intact.     Motor: Motor function is intact. No weakness.     Deep Tendon Reflexes: Reflexes are normal and symmetric.  Psychiatric:        Attention and Perception: Attention normal.        Mood and Affect: Mood normal.        Speech: Speech normal.        Behavior: Behavior normal.        Thought Content: Thought content normal.        Cognition and Memory: Cognition normal.        Judgment: Judgment normal.     Current Outpatient Medications  Medication Instructions   Multiple Vitamin (MULTIVITAMIN WITH MINERALS) TABS tablet 1 tablet, Daily   SUMAtriptan  (IMITREX ) 100 mg, Oral, Once PRN, May repeat in 2 hours if headache persists or recurs.   VITAMIN D  PO Take by mouth.   Past Medical History:  Diagnosis Date   Anemia    ferritin stores low   Back pain    GERD (gastroesophageal reflux disease)    Migraine    Mitral valve prolapse    PONV (postoperative nausea and  vomiting)    Scoliosis    Stress incontinence    Urgency incontinence    Urinary frequency    Vaginal delivery 1993, 1997   Medical/Surgical History Narrative:  Allergic/Intolerant to:  Allergies  Allergen Reactions   Bee Venom Anaphylaxis    Past Surgical History:  Procedure Laterality Date   AUGMENTATION MAMMAPLASTY     BREAST BIOPSY  Bilateral    times three, implants 2 left 1 right    CYSTO N/A 11/06/2015   Procedure: CYSTO;  Surgeon: Harland JAYSON Birkenhead, MD;  Location: [facility];  Service: Gynecology;  Laterality: N/A;   PUBOVAGINAL SLING N/A 11/06/2015   Procedure: MID URETHERAL SLING;  Surgeon: Harland JAYSON Birkenhead, MD;  Location: [facility];  Service: Gynecology;  Laterality: N/A;   UPPER GASTROINTESTINAL ENDOSCOPY     Family History  Problem Relation Age of Onset   Cancer Mother        breast and ovarian   Breast cancer Mother    Stroke Father    Diabetes Father    Migraines Sister    Breast cancer Maternal Aunt    Cancer Maternal Aunt        breast   Stroke Maternal Grandmother    Heart disease Maternal Grandmother    Diabetes Maternal Grandmother    Breast cancer Maternal Grandfather    Cancer Maternal Grandfather        colon   Stroke Maternal Grandfather    Heart disease Maternal Grandfather    Diabetes Maternal Grandfather    Stroke Paternal Grandmother    Heart disease Paternal Grandmother    Diabetes Paternal Grandmother    Stroke Paternal Grandfather    Heart disease Paternal Grandfather    Diabetes Paternal Grandfather    Family History Narrative: history of breast cancer in maternal aunt and colon cancer in maternal grandfather.  Mother recently deceased with history of breast cancer at age 35 and ovarian cancer.  Mother also had hyperthyroidism.  Stroke in maternal grandparents and paternal grandparents.  Diabetes in maternal and paternal grandparents.  Diabetes in father.  Heart disease in both sets of grandparents. Father died at 37 of lung cancer.  Social History    Social History Narrative   Live with husband   Right handed   Caffeine: 32-40 oz in day   Pt works   Married.  Social alcohol consumption.  Does not smoke.  2 adult children.  Husband is a [employer].  Patient has worked as a Advice Worker.  Currently on homemaker.  She has a Event organiser.  Most Recent Health Risks Assessment:   Most Recent Social Determinants of Health (Including Hx of Tobacco, Alcohol, and Drug Use) SDOH Screenings   Food Insecurity: No Food Insecurity (07/10/2023)  Housing: Low Risk  (07/10/2023)  Transportation Needs: No Transportation Needs (07/10/2023)  Utilities: Not At Risk (07/10/2023)  Depression (PHQ2-9): Medium Risk (07/22/2024)  Tobacco Use: Low Risk  (07/22/2024)  Health Literacy: Adequate Health Literacy (07/10/2023)   Social History   Tobacco Use   Smoking status: Never   Smokeless tobacco: Never  Vaping Use   Vaping status: Never Used  Substance Use Topics   Alcohol use: Yes    Alcohol/week: 0.0 standard drinks of alcohol    Comment: occ   Drug use: Yes    Most Recent Fall Risk Assessment:    07/10/2023    2:08 PM  Fall Risk   Falls in the past year? 0  Number falls in past yr: 0  Injury with Fall? 0  Risk for fall due to : No Fall Risks  Follow up Falls evaluation completed   Most Recent Anxiety/Depression Screenings:    07/22/2024    9:16 AM 07/10/2023    2:08 PM  PHQ 2/9 Scores  PHQ - 2 Score 0 0  PHQ- 9 Score 5  Results:  Studies Obtained And Personally Reviewed By Me:  Pap smear normal on 10/27/22.  Had colonoscopy in 2012 per past medical history records. Needs to consider repeat study.  10/26/2023 Mammogram No mammographic evidence of malignancy. Repeat in one year.    07/28/2023 Coronary Calcium score: 0   05/03/2024 Bone density Left Femoral Neck BMD 0.737 T score -2.2.  Labs:  CBC w/ Differential Lab Results  Component Value Date   WBC 4.9 07/19/2024   RBC 4.85  07/19/2024   HGB 14.7 07/19/2024   HCT 45.0 07/19/2024   PLT 302 07/19/2024   MCV 92.8 07/19/2024   MCH 30.3 07/19/2024   MCHC 32.7 07/19/2024   RDW 13.1 07/19/2024   MPV 10.7 07/19/2024   LYMPHSABS 2.1 03/05/2022   MONOABS 320 04/27/2017   BASOSABS 20 07/19/2024    Comprehensive Metabolic Panel Lab Results  Component Value Date   NA 140 07/19/2024   K 4.7 07/19/2024   CL 102 07/19/2024   CO2 30 07/19/2024   GLUCOSE 82 07/19/2024   BUN 14 07/19/2024   CREATININE 0.71 07/19/2024   CALCIUM 10.1 07/19/2024   PROT 6.8 07/19/2024   ALBUMIN 4.6 03/05/2022   AST 18 07/19/2024   ALT 14 07/19/2024   ALKPHOS 100 03/05/2022   BILITOT 0.6 07/19/2024   GFR 84.69 01/30/2015   EGFR 96 07/19/2024   GFRNONAA 89 10/08/2018   Lipid Panel  Lab Results  Component Value Date   CHOL 182 07/19/2024   HDL 69 07/19/2024   LDLCALC 94 07/19/2024   TRIG 95 07/19/2024   A1c Lab Results  Component Value Date   HGBA1C 5.4 07/02/2023    TSH Lab Results  Component Value Date   TSH 1.38 07/19/2024    Assessment & Plan:   Orders Placed This Encounter  Procedures   POCT URINALYSIS DIP (CLINITEK)   Migraine headaches: treated with sumatriptan  100 mg as needed.    Pap smear normal on 10/27/22.  Had colonoscopy in 2012 per past medical history records. Needs to consider repeat study.  10/26/2023 Mammogram No mammographic evidence of malignancy. Repeat in one year.    07/28/2023 Coronary Calcium score: 0   05/03/2024 Bone density Left Femoral Neck BMD 0.737 T score -2.2.      Annual Comprehensive Physical Exam done today including the all of the following: Reviewed patient's Family Medical History Reviewed patient's SDOH and reviewed tobacco, alcohol, and drug use.  Reviewed and updated list of patient's medical providers Assessment of cognitive impairment was done Assessed patient's functional ability Established a written schedule for health screening services Health Risk  Assessent Completed and Reviewed  Discussed health benefits of physical activity, and encouraged her to engage in regular exercise appropriate for her age and condition.    I,Makayla C Reid,acting as a scribe for Ronal JINNY Hailstone, MD.,have documented all relevant documentation on the behalf of Ronal JINNY Hailstone, MD,as directed by  Ronal JINNY Hailstone, MD while in the presence of Ronal JINNY Hailstone, MD.  I, Ronal JINNY Hailstone, MD, have reviewed all documentation for and agree with the above Annual Wellness Visit documentation.  Ronal JINNY Hailstone, MD Internal Medicine 07/22/2024

## 2024-07-20 ENCOUNTER — Ambulatory Visit: Payer: Self-pay | Admitting: Internal Medicine

## 2024-07-20 LAB — COMPREHENSIVE METABOLIC PANEL WITH GFR
AG Ratio: 1.8 (calc) (ref 1.0–2.5)
ALT: 14 U/L (ref 6–29)
AST: 18 U/L (ref 10–35)
Albumin: 4.4 g/dL (ref 3.6–5.1)
Alkaline phosphatase (APISO): 86 U/L (ref 37–153)
BUN: 14 mg/dL (ref 7–25)
CO2: 30 mmol/L (ref 20–32)
Calcium: 10.1 mg/dL (ref 8.6–10.4)
Chloride: 102 mmol/L (ref 98–110)
Creat: 0.71 mg/dL (ref 0.50–1.05)
Globulin: 2.4 g/dL (ref 1.9–3.7)
Glucose, Bld: 82 mg/dL (ref 65–99)
Potassium: 4.7 mmol/L (ref 3.5–5.3)
Sodium: 140 mmol/L (ref 135–146)
Total Bilirubin: 0.6 mg/dL (ref 0.2–1.2)
Total Protein: 6.8 g/dL (ref 6.1–8.1)
eGFR: 96 mL/min/1.73m2 (ref >=60)

## 2024-07-20 LAB — CBC WITH DIFFERENTIAL/PLATELET
Absolute Lymphocytes: 1970 {cells}/uL (ref 850–3900)
Absolute Monocytes: 372 {cells}/uL (ref 200–950)
Basophils Absolute: 20 {cells}/uL (ref 0–200)
Basophils Relative: 0.4 %
Eosinophils Absolute: 338 {cells}/uL (ref 15–500)
Eosinophils Relative: 6.9 %
HCT: 45 % (ref 35.0–45.0)
Hemoglobin: 14.7 g/dL (ref 11.7–15.5)
MCH: 30.3 pg (ref 27.0–33.0)
MCHC: 32.7 g/dL (ref 32.0–36.0)
MCV: 92.8 fL (ref 80.0–100.0)
MPV: 10.7 fL (ref 7.5–12.5)
Monocytes Relative: 7.6 %
Neutro Abs: 2200 {cells}/uL (ref 1500–7800)
Neutrophils Relative %: 44.9 %
Platelets: 302 Thousand/uL (ref 140–400)
RBC: 4.85 Million/uL (ref 3.80–5.10)
RDW: 13.1 % (ref 11.0–15.0)
Total Lymphocyte: 40.2 %
WBC: 4.9 Thousand/uL (ref 3.8–10.8)

## 2024-07-20 LAB — LIPID PANEL
Cholesterol: 182 mg/dL (ref <200)
HDL: 69 mg/dL (ref >=50)
LDL Cholesterol (Calc): 94 mg/dL
Non-HDL Cholesterol (Calc): 113 mg/dL (ref <130)
Total CHOL/HDL Ratio: 2.6 (calc) (ref <5.0)
Triglycerides: 95 mg/dL (ref <150)

## 2024-07-20 LAB — TSH: TSH: 1.38 m[IU]/L (ref 0.40–4.50)

## 2024-07-22 ENCOUNTER — Ambulatory Visit: Payer: 59 | Admitting: Internal Medicine

## 2024-07-22 ENCOUNTER — Encounter: Payer: Self-pay | Admitting: Internal Medicine

## 2024-07-22 VITALS — BP 90/64 | HR 78 | Ht 64.5 in | Wt 122.0 lb

## 2024-07-22 DIAGNOSIS — Z8669 Personal history of other diseases of the nervous system and sense organs: Secondary | ICD-10-CM | POA: Diagnosis not present

## 2024-07-22 DIAGNOSIS — Z0000 Encounter for general adult medical examination without abnormal findings: Secondary | ICD-10-CM

## 2024-07-22 DIAGNOSIS — Z23 Encounter for immunization: Secondary | ICD-10-CM

## 2024-07-22 LAB — POCT URINALYSIS DIP (CLINITEK)
Bilirubin, UA: NEGATIVE
Blood, UA: NEGATIVE
Glucose, UA: NEGATIVE mg/dL
Ketones, POC UA: NEGATIVE mg/dL
Leukocytes, UA: NEGATIVE
Nitrite, UA: NEGATIVE
POC PROTEIN,UA: NEGATIVE
Spec Grav, UA: 1.01 (ref 1.010–1.025)
Urobilinogen, UA: 0.2 U/dL
pH, UA: 6 (ref 5.0–8.0)

## 2024-08-05 NOTE — Patient Instructions (Addendum)
 As always, it was a pleasure to see you today. Please consider repeat colonoscopy. Return in one year or as needed. Pneumococcal 20 vaccine given today.

## 2024-09-20 ENCOUNTER — Ambulatory Visit (INDEPENDENT_AMBULATORY_CARE_PROVIDER_SITE_OTHER): Admitting: Podiatry

## 2024-09-20 ENCOUNTER — Ambulatory Visit (INDEPENDENT_AMBULATORY_CARE_PROVIDER_SITE_OTHER)

## 2024-09-20 DIAGNOSIS — S9031XA Contusion of right foot, initial encounter: Secondary | ICD-10-CM

## 2024-09-20 DIAGNOSIS — S99292A Other physeal fracture of phalanx of left toe, initial encounter for closed fracture: Secondary | ICD-10-CM

## 2024-09-20 DIAGNOSIS — M79672 Pain in left foot: Secondary | ICD-10-CM

## 2024-09-20 DIAGNOSIS — S92512A Displaced fracture of proximal phalanx of left lesser toe(s), initial encounter for closed fracture: Secondary | ICD-10-CM | POA: Diagnosis not present

## 2024-09-20 NOTE — Progress Notes (Signed)
 Patient injured the toe about 4 weeks ago.  Hit it into a chair.  Was immediately painful.  Began to swell and had some black and blue areas on it.  Pains receded a little bit but is still tender.   Physical exam:  General appearance: Pleasant, and in no acute distress. AOx3.  Vascular: Pedal pulses: DP 2/4 bilaterally, PT 2 to/4 bilaterally.  Edematous fifth toe left.  Minimal edema lower extremity bilaterally.. Capillary fill time and bilaterally.  Neurological: Light touch intact feet bilaterally.  Normal Achilles reflex bilaterally.  No clonus or spasticity noted.   Dermatologic:   Skin normal temperature bilaterally.  Skin normal color, tone, and texture bilaterally.  Resolving ecchymosis fifth toe left.  Musculoskeletal: Tenderness and fifth toe tenderness at the PIPJ of the proximal phalanx left.  Normal alignment of fifth toe.  No tenderness at fifth MTP or fifth metatarsal.  Radiographs: 3 views foot left: Transverse fracture at proximal phalangeal head of fifth toe.  Minimal displacement.  No comminution noted.  Normal alignment fifth toe.  Diagnosis: 1.  Pain left foot. 2.  Fracture proximal phalanx fifth toe left, initial encounter 3.  Contusion fifth toe right  Plan: -New patient office visit for evaluation and management level 3.  Modifier 25. - Discussed with her the contusion and fracture on the fifth toe left.  Recommend closed treatment.  Will do buddy splinting and a surgical shoe.  RICE. -Initiate closed treatment nondisplaced fracture without manipulation fifth toe proximal phalanx left.  Will do buddy splinting and wear surgical shoe for 2 to 4 weeks. -Dispensed surgical shoe left  Return 2 weeks follow-up fracture fifth toe and radiographs 3 views left

## 2024-10-05 ENCOUNTER — Ambulatory Visit: Admitting: Podiatry

## 2024-10-05 ENCOUNTER — Ambulatory Visit

## 2024-10-05 DIAGNOSIS — S99292D Other physeal fracture of phalanx of left toe, subsequent encounter for fracture with routine healing: Secondary | ICD-10-CM

## 2024-10-05 DIAGNOSIS — S99292A Other physeal fracture of phalanx of left toe, initial encounter for closed fracture: Secondary | ICD-10-CM | POA: Diagnosis not present

## 2024-10-05 NOTE — Progress Notes (Signed)
 Patient presents follow-up fracture fifth toe.  Much decrease in tenderness.  Still little bit sore.  Some swelling still present.   Physical exam:  General appearance: Pleasant, and in no acute distress. AOx3.  Vascular: Pedal pulses: DP 2/4 bilaterally, PT 2/4 bilaterally.  Some edema fifth toe left.  Capillary fill time immediate.  Neurological: Grossly intact bilaterally  Dermatologic:   Skin normal temperature bilaterally.  Skin normal color, tone, and texture bilaterally.   Musculoskeletal: Tenderness fifth toe left with palpation.  Radiographs: 3 views foot left: Good healing at fracture site with good alignment.  Bony callus formation with some consolidation present.  Diagnosis: 1.  Close fifth toe fracture proximal phalanx left, subsequent visit routine healing  Plan: -POV closed treatment fracture fifth toe left.  Healing well-continue surgical shoe for all weightbearing.  She can buddy splint or not depending on comfort level.  Ice as needed  Return 2 weeks follow-up fracture fifth toe left and radiographs 3 views left foot

## 2024-10-19 ENCOUNTER — Ambulatory Visit: Admitting: Podiatry
